# Patient Record
Sex: Female | Born: 1935 | Race: White | Hispanic: No | Marital: Married | Smoking: Never smoker
Health system: Southern US, Community
[De-identification: ages and names within clinical notes are randomized; demographics above are authoritative.]

## PROBLEM LIST (undated history)

## (undated) DIAGNOSIS — I6529 Occlusion and stenosis of unspecified carotid artery: Secondary | ICD-10-CM

## (undated) DIAGNOSIS — F3289 Other specified depressive episodes: Secondary | ICD-10-CM

## (undated) DIAGNOSIS — I10 Essential (primary) hypertension: Secondary | ICD-10-CM

## (undated) DIAGNOSIS — Z8679 Personal history of other diseases of the circulatory system: Secondary | ICD-10-CM

## (undated) DIAGNOSIS — M353 Polymyalgia rheumatica: Secondary | ICD-10-CM

## (undated) DIAGNOSIS — I2510 Atherosclerotic heart disease of native coronary artery without angina pectoris: Secondary | ICD-10-CM

## (undated) DIAGNOSIS — I6350 Cerebral infarction due to unspecified occlusion or stenosis of unspecified cerebral artery: Secondary | ICD-10-CM

## (undated) DIAGNOSIS — E669 Obesity, unspecified: Secondary | ICD-10-CM

## (undated) DIAGNOSIS — F329 Major depressive disorder, single episode, unspecified: Secondary | ICD-10-CM

## (undated) DIAGNOSIS — E1149 Type 2 diabetes mellitus with other diabetic neurological complication: Secondary | ICD-10-CM

## (undated) DIAGNOSIS — E785 Hyperlipidemia, unspecified: Secondary | ICD-10-CM

## (undated) DIAGNOSIS — D72829 Elevated white blood cell count, unspecified: Secondary | ICD-10-CM

## (undated) HISTORY — DX: Personal history of other diseases of the circulatory system: Z86.79

## (undated) HISTORY — DX: Obesity, unspecified: E66.9

## (undated) HISTORY — DX: Major depressive disorder, single episode, unspecified: F32.9

## (undated) HISTORY — DX: Polymyalgia rheumatica: M35.3

## (undated) HISTORY — PX: OTHER SURGICAL HISTORY: SHX169

## (undated) HISTORY — DX: Type 2 diabetes mellitus with other diabetic neurological complication: E11.49

## (undated) HISTORY — DX: Hyperlipidemia, unspecified: E78.5

## (undated) HISTORY — DX: Elevated white blood cell count, unspecified: D72.829

## (undated) HISTORY — PX: POSTERIOR LAMINECTOMY / DECOMPRESSION CERVICAL SPINE: SUR739

## (undated) HISTORY — PX: TOTAL ABDOMINAL HYSTERECTOMY: SHX209

## (undated) HISTORY — PX: REFRACTIVE SURGERY: SHX103

## (undated) HISTORY — PX: APPENDECTOMY: SHX54

## (undated) HISTORY — DX: Other specified depressive episodes: F32.89

## (undated) HISTORY — DX: Essential (primary) hypertension: I10

## (undated) HISTORY — DX: Cerebral infarction due to unspecified occlusion or stenosis of unspecified cerebral artery: I63.50

## (undated) HISTORY — DX: Occlusion and stenosis of unspecified carotid artery: I65.29

## (undated) HISTORY — DX: Atherosclerotic heart disease of native coronary artery without angina pectoris: I25.10

---

## 1999-09-14 ENCOUNTER — Encounter (HOSPITAL_BASED_OUTPATIENT_CLINIC_OR_DEPARTMENT_OTHER): Payer: Self-pay | Admitting: Internal Medicine

## 1999-09-14 ENCOUNTER — Inpatient Hospital Stay (HOSPITAL_COMMUNITY): Admission: EM | Admit: 1999-09-14 | Discharge: 1999-09-17 | Payer: Self-pay | Admitting: *Deleted

## 1999-11-24 ENCOUNTER — Encounter: Payer: Self-pay | Admitting: Internal Medicine

## 1999-11-24 ENCOUNTER — Ambulatory Visit (HOSPITAL_COMMUNITY): Admission: RE | Admit: 1999-11-24 | Discharge: 1999-11-24 | Payer: Self-pay | Admitting: Internal Medicine

## 2000-02-06 ENCOUNTER — Emergency Department (HOSPITAL_COMMUNITY): Admission: EM | Admit: 2000-02-06 | Discharge: 2000-02-06 | Payer: Self-pay | Admitting: Emergency Medicine

## 2000-03-21 ENCOUNTER — Encounter: Admission: RE | Admit: 2000-03-21 | Discharge: 2000-03-29 | Payer: Self-pay | Admitting: Internal Medicine

## 2000-05-02 ENCOUNTER — Emergency Department (HOSPITAL_COMMUNITY): Admission: EM | Admit: 2000-05-02 | Discharge: 2000-05-02 | Payer: Self-pay | Admitting: *Deleted

## 2000-10-04 ENCOUNTER — Encounter (INDEPENDENT_AMBULATORY_CARE_PROVIDER_SITE_OTHER): Payer: Self-pay | Admitting: Specialist

## 2000-10-04 ENCOUNTER — Other Ambulatory Visit: Admission: RE | Admit: 2000-10-04 | Discharge: 2000-10-04 | Payer: Self-pay | Admitting: Internal Medicine

## 2002-01-03 ENCOUNTER — Ambulatory Visit (HOSPITAL_COMMUNITY): Admission: RE | Admit: 2002-01-03 | Discharge: 2002-01-03 | Payer: Self-pay | Admitting: Neurology

## 2002-01-03 ENCOUNTER — Encounter: Payer: Self-pay | Admitting: Neurology

## 2004-01-20 ENCOUNTER — Inpatient Hospital Stay (HOSPITAL_COMMUNITY): Admission: AD | Admit: 2004-01-20 | Discharge: 2004-01-22 | Payer: Self-pay | Admitting: Neurology

## 2004-01-20 ENCOUNTER — Emergency Department (HOSPITAL_COMMUNITY): Admission: EM | Admit: 2004-01-20 | Discharge: 2004-01-20 | Payer: Self-pay | Admitting: Emergency Medicine

## 2004-01-21 ENCOUNTER — Encounter (INDEPENDENT_AMBULATORY_CARE_PROVIDER_SITE_OTHER): Payer: Self-pay | Admitting: *Deleted

## 2005-08-04 ENCOUNTER — Encounter: Admission: RE | Admit: 2005-08-04 | Discharge: 2005-08-04 | Payer: Self-pay | Admitting: Neurology

## 2005-09-05 ENCOUNTER — Encounter (INDEPENDENT_AMBULATORY_CARE_PROVIDER_SITE_OTHER): Payer: Self-pay | Admitting: *Deleted

## 2005-09-05 ENCOUNTER — Inpatient Hospital Stay (HOSPITAL_COMMUNITY): Admission: EM | Admit: 2005-09-05 | Discharge: 2005-09-16 | Payer: Self-pay | Admitting: Emergency Medicine

## 2005-11-27 ENCOUNTER — Ambulatory Visit (HOSPITAL_COMMUNITY): Admission: RE | Admit: 2005-11-27 | Discharge: 2005-11-27 | Payer: Self-pay | Admitting: Neurosurgery

## 2005-12-05 ENCOUNTER — Ambulatory Visit: Payer: Self-pay | Admitting: Cardiology

## 2005-12-22 ENCOUNTER — Ambulatory Visit: Payer: Self-pay

## 2005-12-26 ENCOUNTER — Ambulatory Visit: Payer: Self-pay | Admitting: Cardiology

## 2006-01-11 ENCOUNTER — Inpatient Hospital Stay (HOSPITAL_COMMUNITY): Admission: RE | Admit: 2006-01-11 | Discharge: 2006-01-15 | Payer: Self-pay | Admitting: Neurosurgery

## 2006-01-23 ENCOUNTER — Emergency Department (HOSPITAL_COMMUNITY): Admission: EM | Admit: 2006-01-23 | Discharge: 2006-01-23 | Payer: Self-pay | Admitting: *Deleted

## 2006-12-02 ENCOUNTER — Inpatient Hospital Stay (HOSPITAL_COMMUNITY): Admission: EM | Admit: 2006-12-02 | Discharge: 2006-12-05 | Payer: Self-pay | Admitting: Emergency Medicine

## 2007-07-10 ENCOUNTER — Ambulatory Visit: Payer: Self-pay | Admitting: Cardiology

## 2007-07-10 ENCOUNTER — Inpatient Hospital Stay (HOSPITAL_COMMUNITY): Admission: AD | Admit: 2007-07-10 | Discharge: 2007-07-16 | Payer: Self-pay | Admitting: Cardiology

## 2007-07-12 ENCOUNTER — Ambulatory Visit: Payer: Self-pay | Admitting: Surgery

## 2007-07-12 ENCOUNTER — Encounter: Payer: Self-pay | Admitting: Cardiology

## 2007-08-02 ENCOUNTER — Ambulatory Visit: Payer: Self-pay | Admitting: Cardiology

## 2007-08-02 LAB — CONVERTED CEMR LAB
Basophils Absolute: 0 10*3/uL (ref 0.0–0.1)
Basophils Relative: 0.4 % (ref 0.0–1.0)
Eosinophils Absolute: 0.2 10*3/uL (ref 0.0–0.7)
HCT: 38.8 % (ref 36.0–46.0)
MCHC: 32.4 g/dL (ref 30.0–36.0)
MCV: 88.6 fL (ref 78.0–100.0)
Monocytes Absolute: 1.2 10*3/uL — ABNORMAL HIGH (ref 0.1–1.0)
Neutro Abs: 7.4 10*3/uL (ref 1.4–7.7)
Neutrophils Relative %: 64.1 % (ref 43.0–77.0)
RBC: 4.37 M/uL (ref 3.87–5.11)

## 2007-08-08 ENCOUNTER — Inpatient Hospital Stay (HOSPITAL_COMMUNITY): Admission: EM | Admit: 2007-08-08 | Discharge: 2007-08-15 | Payer: Self-pay | Admitting: Emergency Medicine

## 2007-08-11 ENCOUNTER — Ambulatory Visit: Payer: Self-pay | Admitting: Infectious Diseases

## 2008-01-15 ENCOUNTER — Ambulatory Visit: Payer: Self-pay | Admitting: Cardiology

## 2008-01-15 LAB — CONVERTED CEMR LAB
Basophils Absolute: 0.1 10*3/uL (ref 0.0–0.1)
CO2: 28 meq/L (ref 19–32)
Glucose, Bld: 233 mg/dL — ABNORMAL HIGH (ref 70–99)
Hemoglobin: 13.4 g/dL (ref 12.0–15.0)
Lymphocytes Relative: 26.3 % (ref 12.0–46.0)
MCHC: 33.7 g/dL (ref 30.0–36.0)
Monocytes Relative: 8.1 % (ref 3.0–12.0)
Neutrophils Relative %: 64.1 % (ref 43.0–77.0)
Platelets: 220 10*3/uL (ref 150–400)
Potassium: 5 meq/L (ref 3.5–5.1)
RDW: 15.9 % — ABNORMAL HIGH (ref 11.5–14.6)
Sodium: 140 meq/L (ref 135–145)

## 2008-01-16 ENCOUNTER — Ambulatory Visit: Payer: Self-pay

## 2008-07-11 ENCOUNTER — Inpatient Hospital Stay (HOSPITAL_COMMUNITY): Admission: EM | Admit: 2008-07-11 | Discharge: 2008-07-17 | Payer: Self-pay | Admitting: Emergency Medicine

## 2008-07-11 ENCOUNTER — Ambulatory Visit: Payer: Self-pay | Admitting: Internal Medicine

## 2008-07-31 DIAGNOSIS — I2510 Atherosclerotic heart disease of native coronary artery without angina pectoris: Secondary | ICD-10-CM

## 2008-07-31 DIAGNOSIS — E785 Hyperlipidemia, unspecified: Secondary | ICD-10-CM | POA: Insufficient documentation

## 2008-07-31 DIAGNOSIS — I110 Hypertensive heart disease with heart failure: Secondary | ICD-10-CM

## 2008-07-31 DIAGNOSIS — I6529 Occlusion and stenosis of unspecified carotid artery: Secondary | ICD-10-CM

## 2008-07-31 DIAGNOSIS — Z8679 Personal history of other diseases of the circulatory system: Secondary | ICD-10-CM

## 2008-07-31 DIAGNOSIS — D72829 Elevated white blood cell count, unspecified: Secondary | ICD-10-CM | POA: Insufficient documentation

## 2008-07-31 DIAGNOSIS — E669 Obesity, unspecified: Secondary | ICD-10-CM

## 2008-07-31 DIAGNOSIS — I6350 Cerebral infarction due to unspecified occlusion or stenosis of unspecified cerebral artery: Secondary | ICD-10-CM | POA: Insufficient documentation

## 2008-07-31 DIAGNOSIS — F329 Major depressive disorder, single episode, unspecified: Secondary | ICD-10-CM

## 2008-07-31 DIAGNOSIS — E1143 Type 2 diabetes mellitus with diabetic autonomic (poly)neuropathy: Secondary | ICD-10-CM

## 2008-07-31 DIAGNOSIS — Z862 Personal history of diseases of the blood and blood-forming organs and certain disorders involving the immune mechanism: Secondary | ICD-10-CM

## 2008-07-31 DIAGNOSIS — M353 Polymyalgia rheumatica: Secondary | ICD-10-CM

## 2008-07-31 DIAGNOSIS — Z8639 Personal history of other endocrine, nutritional and metabolic disease: Secondary | ICD-10-CM

## 2008-08-05 ENCOUNTER — Encounter: Payer: Self-pay | Admitting: Cardiology

## 2008-08-05 ENCOUNTER — Ambulatory Visit: Payer: Self-pay | Admitting: Cardiology

## 2008-09-22 ENCOUNTER — Emergency Department (HOSPITAL_COMMUNITY): Admission: EM | Admit: 2008-09-22 | Discharge: 2008-09-23 | Payer: Self-pay | Admitting: Emergency Medicine

## 2008-11-16 ENCOUNTER — Inpatient Hospital Stay (HOSPITAL_COMMUNITY): Admission: EM | Admit: 2008-11-16 | Discharge: 2008-11-19 | Payer: Self-pay | Admitting: Emergency Medicine

## 2008-11-18 ENCOUNTER — Encounter (INDEPENDENT_AMBULATORY_CARE_PROVIDER_SITE_OTHER): Payer: Self-pay | Admitting: Gastroenterology

## 2009-02-25 ENCOUNTER — Emergency Department (HOSPITAL_COMMUNITY): Admission: EM | Admit: 2009-02-25 | Discharge: 2009-02-26 | Payer: Self-pay | Admitting: Emergency Medicine

## 2009-03-08 ENCOUNTER — Ambulatory Visit (HOSPITAL_COMMUNITY): Admission: RE | Admit: 2009-03-08 | Discharge: 2009-03-08 | Payer: Self-pay | Admitting: Internal Medicine

## 2009-10-05 ENCOUNTER — Telehealth: Payer: Self-pay | Admitting: Internal Medicine

## 2010-05-31 NOTE — Progress Notes (Signed)
Summary: Appt sooner than next available   Phone Note From Other Clinic   Caller: Lowella Bandy [phone] at Nursing Home Call For: Dr Juanda Chance Reason for Call: Schedule Patient Appt Summary of Call: Her Hemocults are geting lower & lower doesnt feel that patient can wait until next available on 12-13-09. Initial call taken by: Leanor Kail [facility],  October 05, 2009 12:41 PM  Follow-up for Phone Call        Pt. will see Dr.Lemuel Boodram on 10-11-09 at 2:15pm. .  Follow-up by: Laureen Ochs LPN,  October 05, 4780 1:34 PM     Appended Document: Appt sooner than next available Per Dr.Herndon Grill, pt. is actually a pt. of Dr.Ganim at [facility]. I let Lowella Bandy know that pt's appt. here is cancelled and she will need to call Dr.Ganims office for GI care.

## 2010-08-04 LAB — CBC
HCT: 37.1 % (ref 36.0–46.0)
Hemoglobin: 12.1 g/dL (ref 12.0–15.0)
MCHC: 32.8 g/dL (ref 30.0–36.0)
RBC: 4.46 MIL/uL (ref 3.87–5.11)
RDW: 16.9 % — ABNORMAL HIGH (ref 11.5–15.5)

## 2010-08-04 LAB — DIFFERENTIAL
Basophils Absolute: 0 10*3/uL (ref 0.0–0.1)
Eosinophils Relative: 1 % (ref 0–5)
Lymphocytes Relative: 20 % (ref 12–46)
Monocytes Absolute: 1.2 10*3/uL — ABNORMAL HIGH (ref 0.1–1.0)
Monocytes Relative: 10 % (ref 3–12)
Neutro Abs: 8.8 10*3/uL — ABNORMAL HIGH (ref 1.7–7.7)

## 2010-08-04 LAB — URINALYSIS, ROUTINE W REFLEX MICROSCOPIC
Glucose, UA: NEGATIVE mg/dL
Hgb urine dipstick: NEGATIVE
pH: 5.5 (ref 5.0–8.0)

## 2010-08-04 LAB — BASIC METABOLIC PANEL
CO2: 27 mEq/L (ref 19–32)
Calcium: 9.6 mg/dL (ref 8.4–10.5)
GFR calc Af Amer: >60 mL/min (ref >60)
GFR calc non Af Amer: >60 mL/min (ref >60)
Glucose, Bld: 138 mg/dL — ABNORMAL HIGH (ref 70–99)
Potassium: 3.8 mEq/L (ref 3.5–5.1)
Sodium: 136 mEq/L (ref 135–145)

## 2010-08-04 LAB — URINE MICROSCOPIC-ADD ON

## 2010-08-07 LAB — CBC
HCT: 21.3 % — ABNORMAL LOW (ref 36.0–46.0)
Hemoglobin: 11 g/dL — ABNORMAL LOW (ref 12.0–15.0)
Hemoglobin: 6.7 g/dL — CL (ref 12.0–15.0)
MCHC: 31.3 g/dL (ref 30.0–36.0)
MCHC: 31.5 g/dL (ref 30.0–36.0)
MCV: 67 fL — ABNORMAL LOW (ref 78.0–100.0)
MCV: 72.9 fL — ABNORMAL LOW (ref 78.0–100.0)
MCV: 73.5 fL — ABNORMAL LOW (ref 78.0–100.0)
MCV: 73.6 fL — ABNORMAL LOW (ref 78.0–100.0)
Platelets: 173 10*3/uL (ref 150–400)
Platelets: 173 K/uL (ref 150–400)
Platelets: 201 10*3/uL (ref 150–400)
Platelets: 219 10*3/uL (ref 150–400)
RBC: 3.17 MIL/uL — ABNORMAL LOW (ref 3.87–5.11)
RBC: 3.97 MIL/uL (ref 3.87–5.11)
RBC: 4.32 MIL/uL (ref 3.87–5.11)
RBC: 4.92 MIL/uL (ref 3.87–5.11)
RDW: 20.3 % — ABNORMAL HIGH (ref 11.5–15.5)
RDW: 22.5 % — ABNORMAL HIGH (ref 11.5–15.5)
RDW: 22.9 % — ABNORMAL HIGH (ref 11.5–15.5)
RDW: 23 % — ABNORMAL HIGH (ref 11.5–15.5)
WBC: 10.3 10*3/uL (ref 4.0–10.5)
WBC: 11.7 10*3/uL — ABNORMAL HIGH (ref 4.0–10.5)
WBC: 12.6 10*3/uL — ABNORMAL HIGH (ref 4.0–10.5)
WBC: 13.8 10*3/uL — ABNORMAL HIGH (ref 4.0–10.5)
WBC: 13.9 10*3/uL — ABNORMAL HIGH (ref 4.0–10.5)
WBC: 14.1 K/uL — ABNORMAL HIGH (ref 4.0–10.5)

## 2010-08-07 LAB — HEMOCCULT GUIAC POC 1CARD (OFFICE): Fecal Occult Bld: POSITIVE

## 2010-08-07 LAB — GLUCOSE, CAPILLARY
Glucose-Capillary: 121 mg/dL — ABNORMAL HIGH (ref 70–99)
Glucose-Capillary: 131 mg/dL — ABNORMAL HIGH (ref 70–99)
Glucose-Capillary: 136 mg/dL — ABNORMAL HIGH (ref 70–99)
Glucose-Capillary: 144 mg/dL — ABNORMAL HIGH (ref 70–99)
Glucose-Capillary: 146 mg/dL — ABNORMAL HIGH (ref 70–99)
Glucose-Capillary: 146 mg/dL — ABNORMAL HIGH (ref 70–99)
Glucose-Capillary: 160 mg/dL — ABNORMAL HIGH (ref 70–99)
Glucose-Capillary: 162 mg/dL — ABNORMAL HIGH (ref 70–99)
Glucose-Capillary: 164 mg/dL — ABNORMAL HIGH (ref 70–99)
Glucose-Capillary: 171 mg/dL — ABNORMAL HIGH (ref 70–99)
Glucose-Capillary: 182 mg/dL — ABNORMAL HIGH (ref 70–99)
Glucose-Capillary: 196 mg/dL — ABNORMAL HIGH (ref 70–99)
Glucose-Capillary: 196 mg/dL — ABNORMAL HIGH (ref 70–99)
Glucose-Capillary: 205 mg/dL — ABNORMAL HIGH (ref 70–99)
Glucose-Capillary: 209 mg/dL — ABNORMAL HIGH (ref 70–99)
Glucose-Capillary: 209 mg/dL — ABNORMAL HIGH (ref 70–99)
Glucose-Capillary: 216 mg/dL — ABNORMAL HIGH (ref 70–99)
Glucose-Capillary: 223 mg/dL — ABNORMAL HIGH (ref 70–99)
Glucose-Capillary: 246 mg/dL — ABNORMAL HIGH (ref 70–99)
Glucose-Capillary: 254 mg/dL — ABNORMAL HIGH (ref 70–99)
Glucose-Capillary: 298 mg/dL — ABNORMAL HIGH (ref 70–99)
Glucose-Capillary: 298 mg/dL — ABNORMAL HIGH (ref 70–99)
Glucose-Capillary: 99 mg/dL (ref 70–99)

## 2010-08-07 LAB — URINALYSIS, ROUTINE W REFLEX MICROSCOPIC
Bilirubin Urine: NEGATIVE
Glucose, UA: NEGATIVE mg/dL
Hgb urine dipstick: NEGATIVE
Ketones, ur: NEGATIVE mg/dL
Nitrite: NEGATIVE
Protein, ur: NEGATIVE mg/dL
Specific Gravity, Urine: 1.006 (ref 1.005–1.030)
Urobilinogen, UA: 0.2 mg/dL (ref 0.0–1.0)
pH: 6 (ref 5.0–8.0)

## 2010-08-07 LAB — TYPE AND SCREEN
ABO/RH(D): O NEG
Antibody Screen: NEGATIVE

## 2010-08-07 LAB — URINE MICROSCOPIC-ADD ON

## 2010-08-07 LAB — COMPREHENSIVE METABOLIC PANEL
ALT: 45 U/L — ABNORMAL HIGH (ref 0–35)
ALT: 47 U/L — ABNORMAL HIGH (ref 0–35)
ALT: 52 U/L — ABNORMAL HIGH (ref 0–35)
AST: 63 U/L — ABNORMAL HIGH (ref 0–37)
AST: 73 U/L — ABNORMAL HIGH (ref 0–37)
AST: 84 U/L — ABNORMAL HIGH (ref 0–37)
Albumin: 3.5 g/dL (ref 3.5–5.2)
Albumin: 3.7 g/dL (ref 3.5–5.2)
Alkaline Phosphatase: 84 U/L (ref 39–117)
CO2: 26 mEq/L (ref 19–32)
Calcium: 9.2 mg/dL (ref 8.4–10.5)
Chloride: 105 mEq/L (ref 96–112)
Chloride: 107 mEq/L (ref 96–112)
Chloride: 109 mEq/L (ref 96–112)
Creatinine, Ser: 0.73 mg/dL (ref 0.4–1.2)
Creatinine, Ser: 0.73 mg/dL (ref 0.4–1.2)
Creatinine, Ser: 0.77 mg/dL (ref 0.4–1.2)
GFR calc Af Amer: >60 mL/min (ref >60)
GFR calc Af Amer: >60 mL/min (ref >60)
GFR calc Af Amer: >60 mL/min (ref >60)
GFR calc non Af Amer: >60 mL/min (ref >60)
Potassium: 4.5 mEq/L (ref 3.5–5.1)
Sodium: 139 mEq/L (ref 135–145)
Sodium: 139 mEq/L (ref 135–145)
Sodium: 142 mEq/L (ref 135–145)
Total Bilirubin: 0.3 mg/dL (ref 0.3–1.2)
Total Bilirubin: 0.3 mg/dL (ref 0.3–1.2)
Total Bilirubin: 0.6 mg/dL (ref 0.3–1.2)

## 2010-08-07 LAB — BASIC METABOLIC PANEL WITH GFR
BUN: 17 mg/dL (ref 6–23)
Calcium: 8.9 mg/dL (ref 8.4–10.5)
GFR calc Af Amer: >60 mL/min (ref >60)
GFR calc non Af Amer: >60 mL/min (ref >60)
Glucose, Bld: 128 mg/dL — ABNORMAL HIGH (ref 70–99)
Sodium: 137 meq/L (ref 135–145)

## 2010-08-07 LAB — LIPID PANEL
LDL Cholesterol: 53 mg/dL (ref 0–99)
Total CHOL/HDL Ratio: 3.8 RATIO
Triglycerides: 124 mg/dL (ref <150)
VLDL: 25 mg/dL (ref 0–40)

## 2010-08-07 LAB — DIFFERENTIAL
Basophils Absolute: 0.1 K/uL (ref 0.0–0.1)
Basophils Relative: 1 % (ref 0–1)
Eosinophils Absolute: 0.3 10*3/uL (ref 0.0–0.7)
Eosinophils Relative: 2 % (ref 0–5)
Lymphocytes Relative: 34 % (ref 12–46)
Lymphs Abs: 4.8 10*3/uL — ABNORMAL HIGH (ref 0.7–4.0)
Monocytes Absolute: 1.3 10*3/uL — ABNORMAL HIGH (ref 0.1–1.0)
Monocytes Relative: 9 % (ref 3–12)
Neutro Abs: 7.6 10*3/uL (ref 1.7–7.7)
Neutrophils Relative %: 54 % (ref 43–77)

## 2010-08-07 LAB — CK TOTAL AND CKMB (NOT AT ARMC)
CK, MB: 0.9 ng/mL (ref 0.3–4.0)
CK, MB: 1 ng/mL (ref 0.3–4.0)
CK, MB: 1 ng/mL (ref 0.3–4.0)
Relative Index: INVALID (ref 0.0–2.5)
Relative Index: INVALID (ref 0.0–2.5)
Total CK: 27 U/L (ref 7–177)
Total CK: 44 U/L (ref 7–177)

## 2010-08-07 LAB — TSH: TSH: 4.048 u[IU]/mL (ref 0.350–4.500)

## 2010-08-07 LAB — ABO/RH: ABO/RH(D): O NEG

## 2010-08-07 LAB — TROPONIN I
Troponin I: 0.01 ng/mL (ref 0.00–0.06)
Troponin I: 0.02 ng/mL (ref 0.00–0.06)

## 2010-08-07 LAB — BASIC METABOLIC PANEL
CO2: 26 mEq/L (ref 19–32)
Chloride: 104 mEq/L (ref 96–112)
Creatinine, Ser: 0.81 mg/dL (ref 0.4–1.2)
Potassium: 4.1 mEq/L (ref 3.5–5.1)

## 2010-08-09 LAB — GLUCOSE, CAPILLARY
Glucose-Capillary: 182 mg/dL — ABNORMAL HIGH (ref 70–99)
Glucose-Capillary: 193 mg/dL — ABNORMAL HIGH (ref 70–99)
Glucose-Capillary: 211 mg/dL — ABNORMAL HIGH (ref 70–99)

## 2010-08-11 LAB — COMPREHENSIVE METABOLIC PANEL
ALT: 30 U/L (ref 0–35)
ALT: 38 U/L — ABNORMAL HIGH (ref 0–35)
AST: 51 U/L — ABNORMAL HIGH (ref 0–37)
Albumin: 3.1 g/dL — ABNORMAL LOW (ref 3.5–5.2)
Alkaline Phosphatase: 62 U/L (ref 39–117)
Alkaline Phosphatase: 54 U/L (ref 39–117)
BUN: 15 mg/dL (ref 6–23)
CO2: 27 mEq/L (ref 19–32)
CO2: 30 mEq/L (ref 19–32)
Calcium: 9.6 mg/dL (ref 8.4–10.5)
Chloride: 105 mEq/L (ref 96–112)
Chloride: 108 mEq/L (ref 96–112)
Chloride: 99 mEq/L (ref 96–112)
Creatinine, Ser: 0.85 mg/dL (ref 0.4–1.2)
GFR calc Af Amer: >60 mL/min (ref >60)
GFR calc non Af Amer: >60 mL/min (ref >60)
GFR calc non Af Amer: >60 mL/min (ref >60)
Glucose, Bld: 159 mg/dL — ABNORMAL HIGH (ref 70–99)
Glucose, Bld: 125 mg/dL — ABNORMAL HIGH (ref 70–99)
Glucose, Bld: 168 mg/dL — ABNORMAL HIGH (ref 70–99)
Potassium: 4.3 mEq/L (ref 3.5–5.1)
Potassium: 4.6 mEq/L (ref 3.5–5.1)
Sodium: 141 mEq/L (ref 135–145)
Total Bilirubin: 0.5 mg/dL (ref 0.3–1.2)
Total Bilirubin: 0.5 mg/dL (ref 0.3–1.2)
Total Bilirubin: 0.9 mg/dL (ref 0.3–1.2)
Total Protein: 5.8 g/dL — ABNORMAL LOW (ref 6.0–8.3)
Total Protein: 5.5 g/dL — ABNORMAL LOW (ref 6.0–8.3)

## 2010-08-11 LAB — GLUCOSE, CAPILLARY
Glucose-Capillary: 135 mg/dL — ABNORMAL HIGH (ref 70–99)
Glucose-Capillary: 144 mg/dL — ABNORMAL HIGH (ref 70–99)
Glucose-Capillary: 144 mg/dL — ABNORMAL HIGH (ref 70–99)
Glucose-Capillary: 164 mg/dL — ABNORMAL HIGH (ref 70–99)
Glucose-Capillary: 206 mg/dL — ABNORMAL HIGH (ref 70–99)
Glucose-Capillary: 232 mg/dL — ABNORMAL HIGH (ref 70–99)
Glucose-Capillary: 37 mg/dL — CL (ref 70–99)
Glucose-Capillary: 391 mg/dL — ABNORMAL HIGH (ref 70–99)
Glucose-Capillary: 74 mg/dL (ref 70–99)
Glucose-Capillary: 192 mg/dL — ABNORMAL HIGH (ref 70–99)
Glucose-Capillary: 200 mg/dL — ABNORMAL HIGH (ref 70–99)
Glucose-Capillary: 296 mg/dL — ABNORMAL HIGH (ref 70–99)
Glucose-Capillary: 54 mg/dL — ABNORMAL LOW (ref 70–99)
Glucose-Capillary: 88 mg/dL (ref 70–99)

## 2010-08-11 LAB — BASIC METABOLIC PANEL
Chloride: 108 mEq/L (ref 96–112)
GFR calc non Af Amer: >60 mL/min (ref >60)
Potassium: 4.6 mEq/L (ref 3.5–5.1)
Sodium: 142 mEq/L (ref 135–145)

## 2010-08-11 LAB — DIFFERENTIAL
Basophils Absolute: 0 10*3/uL (ref 0.0–0.1)
Eosinophils Absolute: 0.1 10*3/uL (ref 0.0–0.7)
Eosinophils Relative: 1 % (ref 0–5)
Lymphocytes Relative: 31 % (ref 12–46)
Lymphs Abs: 4.8 10*3/uL — ABNORMAL HIGH (ref 0.7–4.0)
Neutrophils Relative %: 61 % (ref 43–77)

## 2010-08-11 LAB — CBC
HCT: 34.3 % — ABNORMAL LOW (ref 36.0–46.0)
HCT: 39.2 % (ref 36.0–46.0)
Hemoglobin: 11.6 g/dL — ABNORMAL LOW (ref 12.0–15.0)
Hemoglobin: 12.6 g/dL (ref 12.0–15.0)
Hemoglobin: 13 g/dL (ref 12.0–15.0)
Hemoglobin: 13.6 g/dL (ref 12.0–15.0)
MCHC: 33.4 g/dL (ref 30.0–36.0)
MCV: 96.1 fL (ref 78.0–100.0)
MCV: 97.3 fL (ref 78.0–100.0)
RBC: 3.6 MIL/uL — ABNORMAL LOW (ref 3.87–5.11)
RBC: 3.81 MIL/uL — ABNORMAL LOW (ref 3.87–5.11)
RBC: 4.03 MIL/uL (ref 3.87–5.11)
RBC: 4.23 MIL/uL (ref 3.87–5.11)
RDW: 14.4 % (ref 11.5–15.5)
RDW: 13.5 % (ref 11.5–15.5)
WBC: 10.3 10*3/uL (ref 4.0–10.5)
WBC: 11.7 10*3/uL — ABNORMAL HIGH (ref 4.0–10.5)
WBC: 15.7 10*3/uL — ABNORMAL HIGH (ref 4.0–10.5)

## 2010-08-11 LAB — URINALYSIS, ROUTINE W REFLEX MICROSCOPIC
Ketones, ur: NEGATIVE mg/dL
Nitrite: NEGATIVE
Specific Gravity, Urine: 1.01 (ref 1.005–1.030)
Urobilinogen, UA: 0.2 mg/dL (ref 0.0–1.0)
pH: 8 (ref 5.0–8.0)

## 2010-08-11 LAB — CARDIAC PANEL(CRET KIN+CKTOT+MB+TROPI)
CK, MB: 1.9 ng/mL (ref 0.3–4.0)
CK, MB: 2 ng/mL (ref 0.3–4.0)
Relative Index: INVALID (ref 0.0–2.5)
Relative Index: INVALID (ref 0.0–2.5)
Total CK: 32 U/L (ref 7–177)
Total CK: 37 U/L (ref 7–177)
Troponin I: 0.03 ng/mL (ref 0.00–0.06)
Troponin I: 0.04 ng/mL (ref 0.00–0.06)

## 2010-08-11 LAB — PROTIME-INR
INR: 1 (ref 0.00–1.49)
Prothrombin Time: 13.9 seconds (ref 11.6–15.2)

## 2010-08-11 LAB — TSH: TSH: 1.824 u[IU]/mL (ref 0.350–4.500)

## 2010-08-11 LAB — URINE MICROSCOPIC-ADD ON

## 2010-08-11 LAB — GLUCOSE, RANDOM: Glucose, Bld: 143 mg/dL — ABNORMAL HIGH (ref 70–99)

## 2010-09-13 NOTE — Discharge Summary (Signed)
[name] [name], [name] [name] NO.:  [id number]   MEDICAL RECORD NO.:  [id number]          PATIENT TYPE:  INP   LOCATION:  5524                         FACILITY:  [facility]   PHYSICIAN:  Theodosia Paling, MD    DATE OF BIRTH:  Sep 27, 1926   DATE OF ADMISSION:  11/16/2008  DATE OF DISCHARGE:  11/19/2008                               DISCHARGE SUMMARY   PRIMARY CARE PHYSICIAN:  [facility], Dr. Murray Hodgkins.   ADMITTING HISTORY:  Please refer to the excellent admission note  dictated by Dr. Della Goo under history of present illness.   DISCHARGE DIAGNOSES:  1. Anemia.  2. Gastrointestinal bleed of unknown origin.  3. Transient ischemic attack.   SECONDARY DIAGNOSES:  1. History of type 2 diabetes mellitus.  2. History of hypertension.  3. History of coronary artery disease.  4. History of degenerative joint disease.   DISCHARGE MEDICATIONS:  1. Amlodipine 5 mg p.o. daily.  2. Atenolol 75 mg p.o. daily.  3. Lotensin 10 mg p.o. daily.  4. Valium 5 mg p.o. nightly.  5. Aranesp 45 mg p.o. q.12 hours.  6. Protonix 40 mg p.o. q.12 hours.  7. Senna 1 tab p.o. nightly p.r.n.  8. Zoloft 75 mg p.o. daily.  9. Effexor venlafaxine 25 mg p.o. daily.  10.Sliding scale insulin, sensitive.  11.Lantus 5 units subcutaneous nightly.   HOSPITAL COURSE:  1. The following issues were addressed during the hospitalization.      Transient ischemic attack.  The patient apparently had transient      facial drooping, which has resolved at the time of discharge.  She      underwent brain imaging, which was negative for any acute stroke.      At the time of discharge, the patient does not have any severe      motor deficits.  2. Gastrointestinal bleed.  The patient on arrival had a hemoglobin of      3.7 and fecal occult blood test was positive for blood.  The      patient underwent upper gastrointestinal endoscopy and colonoscopy,      which were essentially negative.  In  addition, CT angiography was      also performed and normal.  The patient is hemodynamically stable.      Her H and H were stable throughout the hospitalization.  She will      need follow up CBC in 1-week's time.  3. Microcytic anemia.  As mentioned under gastrointestinal bleed.      Iron will be continued.  CBC will be followed in 1-week's time.  4. Diabetes mellitus.  I have included 5 units of Lantus subcutaneous      nightly along with sliding scale insulin.  Further antidiabetic      agents can be adjusted in the skilled nursing facility as well.   CONSULTATIONS:  Dr. Evette Cristal on November 16, 2008.   PROCEDURES:  Endoscopy and colonoscopy November 18, 2008, which showed  diverticulosis, mild polyp in the descending colon.  Small rectal polyp.  Normal esophagogastroduodenoscopy.   IMAGING PERFORMED:  CT of the  head performed November 16, 2008 showing no  acute intracranial abnormality.  Cerebral atrophy and small vessel  ischemic damage.  CT angiography abdomen and pelvis showing no acute  findings or explanations for anemia with mildly prominent lymph node in  the upper abdomen.   DISPOSITION:  The patient is going back to skilled nursing facility for  further rehab.  She is to follow up with Dr. Murray Hodgkins in 1 week's  time with CBC and also for diabetes management.  Total time spent in  discharge of this patient 1 hour.      Theodosia Paling, MD  Electronically Signed     NP/MEDQ  D:  11/19/2008  T:  11/19/2008  Job:  [id number]   cc:   Lenon Curt. Chilton Si, M.D.

## 2010-09-13 NOTE — Consult Note (Signed)
[name] [name], [name] [name] NO.:  [id number]   MEDICAL RECORD NO.:  [id number]          PATIENT TYPE:  INP   LOCATION:  6522                         FACILITY:  [facility]   PHYSICIAN:  Graylin Shiver, M.D.   DATE OF BIRTH:  20-Oct-1926   DATE OF CONSULTATION:  11/16/2008  DATE OF DISCHARGE:                                 CONSULTATION   HISTORY OF PRESENT ILLNESS:  The patient is an 77 year old female  admitted to the hospital with complaints of headache, facial drooping,  dyspnea and fatigue.  She was found to have heme-positive stools and  have a hemoglobin of 6.7.  She denies any specific signs of GI bleeding.  She denies hematemesis, melena or hematochezia.  She recalls having a  colonoscopy many years ago and thinks it was normal.  She denies peptic  ulcer disease.  There is a family history of colon polyps in her  brother.   PAST HISTORY:  1. Hypertension.  2. Coronary artery disease.  She has a stent and is on Plavix.  3. History of type 2 diabetes.  4. Diabetic neuropathy and retinopathy.  5. Prior CVAs with right hemiparesis in the past.  6. Degenerative joint disease.  7. Cervical disk disease.  8. Peripheral vascular disease.   SURGERIES:  Abdominal hysterectomy, appendectomy, cervical disk  decompression, cataracts laser surgery to both eyes.   ALLERGIES:  PENICILLIN, CODEINE, and HYDROCODONE.   REVIEW OF SYSTEMS:  Not complaining of chest pain or shortness of breath  at this time.   PHYSICAL EXAMINATION:  GENERAL:  She is alert, oriented and does not  appear in any acute distress at this time.  VITAL SIGNS:  Stable.  HEART:  Regular rhythm.  No murmurs.  LUNGS:  Clear.  ABDOMEN:  Soft, nontender, no hepatosplenomegaly.   IMPRESSION:  1. Anemia with hemoglobin on admission of 6.7.  2. Heme-positive stool.  3. Multiple other medical problems as stated above.   PLAN:  Transfuse blood to get the hemoglobin and hematocrit up to an  adequate safe  level, then we will plan to do EGD and colonoscopy after  an adequate prep after she is a little stronger and can tolerate that.           ______________________________  Graylin Shiver, M.D.     SFG/MEDQ  D:  11/16/2008  T:  11/17/2008  Job:  [id number]   cc:   [facility]  Lenon Curt. Chilton Si, M.D.

## 2010-09-13 NOTE — Discharge Summary (Signed)
NAMEAVONDA, [name]             ACCOUNT NO.:  [id number]   MEDICAL RECORD NO.:  [id number]          PATIENT TYPE:  INP   LOCATION:  6735                         FACILITY:  [facility]   PHYSICIAN:  Mobolaji B. Bakare, M.D.DATE OF BIRTH:  June 15, 1926   DATE OF ADMISSION:  12/01/2006  DATE OF DISCHARGE:                               DISCHARGE SUMMARY   PRIMARY CARE PHYSICIAN:  Dr. Murray Hodgkins   FINAL DIAGNOSES:  1. Gastroenteritis, Clostridium difficile negative.  2. Diabetes mellitus with hypoglycemia.  3. Hypertension.  4. Dehydration.  5. Hyperkalemia, resolved.   SECONDARY DIAGNOSES:  1. History of cerebrovascular accident with right-sided hemiparesis.  2. Diabetic neuropathy.  3. Coronary artery disease.  4. History of polymyalgia rheumatica.  5. Cervical disk disease.   PROCEDURES:  CT scan of abdomen and pelvis was unremarkable for acute  intraabdominal or pelvic process.  Abdominal x-ray showed no acute  cardiopulmonary and abdominal process.   BRIEF HISTORY:  [name] [name] is a pleasant 77 year old Caucasian female  who resides at home with her husband; actually, her husband just  returned from a skilled nursing facility.  She presents with abdominal  cramping, nausea, vomiting, diarrhea for about 5 days and she has had  decreased p.o. intake, has been feeling progressively weaker.  She  presented to the emergency room.  Vital signs were blood pressure  157/32, temperature of 98.4.  Initial laboratory data revealed white  cell of 18,000 with hemoglobin of 12.7 and platelets of 256.  Her BUN  and creatinine were within normal limits.   She was admitted for dehydration, gastroenteritis.  Stools were sent for  further evaluation.   HOSPITAL COURSE:  #1 - GASTROENTERITIS.  The patient was started on IV  fluids half normal saline with potassium supplement.  She was managed  symptomatically with Phenergan and Dilaudid as needed, and also Lomotil  p.r.n.  Stool was sent for  culture.  Culture is pending.  She had two  negative C. difficile toxins.  She was, however, started on empiric  treatment for C. difficile given the fact that she had abdominal cramps  and diarrhea with leukocytosis of 24,000.  She would continue on Flagyl  to complete 10 days.  The patient is responding to Flagyl.  Currently,  white cell count has improved to 13,000 and I anticipate complete  resolution.  Diarrhea has resolved.  Stool consistency is normalizing.  Dehydration has improved.  BUN/creatinine have improved (7/0.84  respectively).  She is tolerating advancing her diet.   The patient developed hyperkalemia due to potassium supplement in the IV  fluid.  This has been changed and potassium has now normalized.   #2 - DIABETES MELLITUS WITH HYPOGLYCEMIA.  The patient's home dose of  insulin treatment was reduced on admission and it was noted that her  CBGs were trending downwards as she had an episode of hypoglycemia with  CBG of 25.  Insulin therapy was held and the patient's CBG is now  improving such that we can restart her medication at lower doses.  These  will be adjusted as necessary until discharge.   #3 -  HYPERTENSION.  The patient's blood pressure was normal on  admission.  Given the dehydration and possibility of renal  insufficiency, ACE inhibitor was held and atenolol was also held.  Her  blood pressure is now uncontrolled.  We are resuming atenolol and  benazepril.   DISPOSITION:  The patient plans to return home.  She may require home  health PT and OT.  She will be evaluated by therapist during this  hospitalization.   LABORATORY DATA AT THE TIME OF DICTATION:  Sodium 142, potassium 4.2,  chloride 110, bicarb 24, BUN 7, creatinine 0.84, and glucose 140.  White  cell 13,000; hemoglobin 11.6; platelets 197.   Addendum will be added to this dictation at the time of discharge.      Mobolaji B. Corky Downs, M.D.  Electronically Signed     MBB/MEDQ  D:   12/04/2006  T:  12/04/2006  Job:  [id number]   cc:   Lenon Curt. Chilton Si, M.D.

## 2010-09-13 NOTE — Discharge Summary (Signed)
[name] [name], [name] [name]             ACCOUNT NO.:  [id number]   MEDICAL RECORD NO.:  [id number]          PATIENT TYPE:  INP   LOCATION:  3001                         FACILITY:  [facility]   PHYSICIAN:  Hind I Elsaid, MD      DATE OF BIRTH:  02-05-1927   DATE OF ADMISSION:  08/08/2007  DATE OF DISCHARGE:  08/15/2007                               DISCHARGE SUMMARY   PRIMARY CARE PHYSICIAN:  Bertram Millard. Hyacinth Meeker, M.D. [employer] Event organiser G. Chilton Si, M.D.   PRIMARY CARDIOLOGIST:  Arturo Morton. Riley Kill, M.D., [facility].   NEUROLOGIST:  Pramod P. Pearlean Brownie, M.D.   DISCHARGE DIAGNOSES:  1. Bilateral shoulder pain, felt to be secondary to relapse of      polymyalgia rheumatica.  2. Insulin-dependent diabetes mellitus.  3. History of coronary artery disease, status post percutaneous      intervention with drug-eluting stent in the right coronary artery      in March 2009.  4. Persistent leucocytosis of unclear etiology.  5. Subclavian stenosis.  6. History of intracranial aneurysm.  7. History of transient ischemic attack.  8. Hypertension.  9. Hyperlipidemia.  10.Diabetic neuropathy.  11.History of polymyalgia rheumatica.  12.Cervical disc disease.   MEDICATIONS:  1. Aspirin 325 mg 1 daily.  2. Plavix 75 mg daily.  3. Amlodipine/benazepril 5/20 1 daily.  4. Atenolol 50 mg p.o. daily.  5. Lasix 40 mg daily.  6. Tramadol 50 mg p.o. q.i.d. p.r.n.  7. Isosorbide mononitrate 60 mg daily.  8. Diazepam 5 mg p.o. nightly. p.r.n.  9. Insulin Lantus 22 units subcu nightly.  10.Insulin NovoLog 40 units subcu with the meal.  11- prednisone 20 mg po to be tappered to 10 mg anD followup with MD   CONSULTATION:  Orthopedics consulted.  Infectious disease was consulted.   HISTORY OF PRESENT ILLNESS:  This is a 77 year old female with history  of coronary artery disease, insulin-dependent diabetes mellitus  presented with the history of increasing bilateral shoulder pain.  The  patient has a history of  previous cervical discectomy and fusion at C5  and C6 and C6-C7 with plates and screw in 2007 done by Dr. Sylvie Farrier.  She  has been seen by Dr. Sylvie Farrier and evaluated herself to be asymptomatic  related to her cervical spine.  She denies any numbness or paresthesias  in her upper extremity.  She mainly complains of left shoulder pain and  a pain involving the entire left arm, also she had pain into her right  arm.  She has a history of diabetic peripheral neuropathy.  She also has  a history of polymyalgia rheumatica.  The patient was admitted to the  hospital for this evaluation.   PROCEDURES:  1. X-ray:  Cervical x-ray negative for fracture or other acute      abnormality.  Chest x-ray has no active lung issue.  2. MRI of cervical spine, status post C5 to C7 fusion without      complicating feature and good decompression of the thecal sac and      foramina.  Mild spondylosis of C3-C4 without notable central canal  or foraminal stenosis.  No interval changes.  Small broad-based      disc bulge, particularly seen at C4, T3, and T4.  3. CT head without, contrast negative for acute intracranial      hemorrhage or edema.  Atrophy was small with an ischemic change.  4. X-ray of the left shoulder, no acute abnormality.  5. MRI of the upper extremity, glenohumeral effusion, subcortical and      subacromial and subdeltoid bursitis.  Differentiation include      inflammatory arthritis, infection, or posttraumatic reactive      change.  If infection is considered, consider aspiration, moderate      joint osteoarthritis.   HOSPITAL COURSE:  1. The patient was admitted for evaluation of left more than right      shoulder pain and tenderness to palpation with limitation of      shoulder movement.  The patient had a blood drawn mainly for ESR      which found to be 75 and C-reactive protein of 19, also rheumatoid      factor was less than 20.  Antinuclear factor was negative. The      patient had  aspiration of the left subacromial joint and the sample      was sent for culture and sensitivity which was negative.  The      patient also received intraarticular steroid, mainly Depo-Medrol 40      mg/1 mL.  The patient noticed improvement on the patient's      condition.  Also patient was continued on pain medications.      Infectious disease were consulted for evaluation of any underlying      infection.  Diagnosis was made of relapse of polymyalgia rheumatica      and the patient accordingly started on prednisone p.o.  No evidence      of infection or septic arthritis during hospitalization.  The      patient has significant improvement after started on prednisone      p.o. and diagnosis made with the relapse of polymyalgia rheumatica.      The patient need to followup with Dr. Murray Hodgkins for tapering      prednisone, so the patient reach to the appropriate small dose to      control her pain.  2. Diabetes mellitus remained labile with the previous reading.  We      understand that prednisone will increase her sugar.  The patient      was switched to insulin Lantus 22 units subcu with NovoLog 4 units      subcu to cover each meal.  The patient was advised to follow with      Murray Hodgkins, MD for further adjustment of her fingerstick.  3. Persistent leucocytosis.  There is no evidence of infection.  White      blood cells are coming down.  Her last white blood cell is 13.  The      patient need to follow up closely with her primary care physician.      During hospitalization, the patient also receive PT and OT and      their recommendation was no need for outpatient physical therapy.      The patient's both shoulders significantly improved.  We felt that      the bilateral shoulder pain was most probably secondary to flare of      polymyalgia rheumatica.  The patient need to be discharged to home  on prednisone and dose to be tapered by her primary care physician.      Hind  Bosie Helper, MD  Electronically Signed    HIE/MEDQ  D:  08/15/2007  T:  08/16/2007  Job:  [id number]   cc:   Lenon Curt. Chilton Si, M.D.  Frederica Kuster, MD

## 2010-09-13 NOTE — Discharge Summary (Signed)
[name] [name], [name] [name] NO.:  [id number]   MEDICAL RECORD NO.:  [id number]          PATIENT TYPE:  INP   LOCATION:  6529                         FACILITY:  [facility]   PHYSICIAN:  Arturo Morton. Riley Kill, MD, FACCDATE OF BIRTH:  [birth year]   DATE OF ADMISSION:  07/10/2007  DATE OF DISCHARGE:  07/16/2007                               DISCHARGE SUMMARY   PRIMARY CARDIOLOGIST:  Maisie Fus D. Riley Kill, MD, [facility]   PRIMARY CARE PHYSICIAN:  Lenon Curt. Chilton Si, M.D.   NEUROLOGIST:  Pramod P. Pearlean Brownie, M.D. and Clydene Fake, M.D.   DISCHARGE DIAGNOSES:  1. Unstable angina.  2. Coronary artery disease, status post percutaneous coronary      intervention and stenting of the mid right coronary artery with 3.0      x 15 mm Promus drug eluting stent this admission.  3. History of 3.5 cm intracranial aneurysm by MRA in 2005.      a.     Repeat MRA this admission shows no evidence of anterior       communicating aneurysm with no definite aneurysm seen on prior       study after further review.  4. Peripheral vascular disease with history of subclavian stenosis.  5. History of transient ischemic attacks.  6. 40-60% right internal carotid artery stenosis by ultrasound in      2007.  7. Status post neck surgery.  8. Hypertension.  9. Hyperlipidemia.  10.Insulin-dependent diabetes mellitus.  11.Diabetic peripheral neuropathy.  12.Obesity.  13.Depression.  14.Leukocytosis.  15.Polymyalgia rheumatica.   ALLERGIES:  CODEINE causes GI upset.  PENICILLIN causes rash.  IBUPROFEN, HYDROCODONE cause nightmares.   PROCEDURE:  1. Left heart catheterization with successful PCI and stenting of the      mid RCA with drug eluting stent.  2. Two-dimensional echocardiogram on July 12, 2007, showing an EF of      55-60% with prominent epicardial fat pad.   HISTORY OF PRESENT ILLNESS:  A 77 year old Caucasian female with a prior  history of CAD, who was evaluated in our clinic on July 10, 2007, for  progressive angina with a more significant episode occurring during the  prior weekend.  The decision was made to admit for further evaluation.   HOSPITAL COURSE:  We initially held off on anticoagulation secondary to  history of intracranial aneurysm based on MRA performed in September of  2005.  Neurology was consulted and they recommended a repeat MRA.  The  MRA showed no evidence of intracranial aneurysm.  The previous test was  also reviewed by radiology and no definite aneurysm was seen there  either.  The patient was placed on heparin therapy and underwent a  cardiac catheterization on March 12, revealing a 70-75% stenosis in the  proximal to mid right coronary artery.  Ejection fraction was normal.  She had no recurrent chest discomfort over the weekend and plans were  made for PCI of the RCA on Monday, March 16.  She was taken back to lab  on March 16, and underwent successful PCI and stenting of the proximal  RCA with placement of a 3.0  x 15 mm Promus drug eluting stent.  She  tolerated this procedure well and post procedure, she did have a brief  episode of chest discomfort relieved with intravenous Fentanyl.  She had  no EKG changes at that time and cardiac markers have since remained  negative.  She has been ambulating without recurrent discomfort and will  be discharged home today in good condition.   Notably, the patient has exhibited a leukocytosis throughout her  admission, but has remained afebrile.  Her urinalysis showed 36 WBCs,  although she had no complaints of dysuria, urgency, or frequency.  Urine  culture showed growth of multiple organisms and at this time, repeat  culture is pending.  Upon trending her leukocytes in the past, she has  chronic elevated WBCs.  We have no plans to initiate antibiotics at this  time unless urine culture reveals significant abnormality.   DISCHARGE LABORATORY DATA:  Hemoglobin 12.2, hematocrit 36.8, WBC 15.5,  platelets 204.   D-dimer 0.89.  INR 1.0. Sodium 137, potassium 4.3,  chloride 105, CO2 26, BUN 17, creatinine 0.79, glucose 164, calcium 9.2,  total bilirubin 0.5, alkaline phosphatase 85, AST 39, ALT 24, total  protein 6.8, albumin 3.8, CK 80, MB 1.8.  Total cholesterol 104,  triglycerides 280, HDL 22, LDL 26.  TSH 1.261.  Urinalysis showed small  amount of leukocytes with few epithelials and 3-6 WBC.  Urine culture is  pending.   DISPOSITION:  The patient is being discharged home today in good  condition.   FOLLOWUP:  We have arranged for follow-up with Dr. Riley Kill on April 3,  at 9 a.m.  She is to follow up with Dr. Chilton Si as previously scheduled.   DISCHARGE MEDICATIONS:  1. Aspirin 325 mg daily.  2. Plavix 75 mg daily.  3. Amlodipine benazepril 5/20 mg daily.  4. Atenolol 50 mg daily.  5. IMDUR 60 mg daily.  6. Multivitamin one daily.  7. Zoloft 100 mg daily.  8. Lasix 40 mg daily.  9. Humulin Insulin as previously prescribed.  10.Zolpidem 5 mg q.h.s.  11.Tramadol 50 mg q.i.d. p.r.n.  12.Diazepam 5 mg daily p.r.n.  13.Nitroglycerin 0.4 mg sublingual p.r.n. chest pain.   OUTSTANDING LAB STUDIES:  Urine culture is pending.   DURATION DISCHARGE ENCOUNTER:  40 minutes including physician time.      Nicolasa Ducking, ANP      Arturo Morton. Riley Kill, MD, [facility]  Electronically Signed    CB/MEDQ  D:  07/16/2007  T:  07/16/2007  Job:  [id number]   cc:   Lenon Curt. Chilton Si, M.D.

## 2010-09-13 NOTE — Op Note (Signed)
[name] [name], [name] [name] NO.:  [id number]   MEDICAL RECORD NO.:  [id number]           PATIENT TYPE:   LOCATION:                                 FACILITY:   PHYSICIAN:  Graylin Shiver, M.D.   DATE OF BIRTH:  [birth year]   DATE OF PROCEDURE:  11/18/2008  DATE OF DISCHARGE:                               OPERATIVE REPORT   INDICATIONS FOR PROCEDURE:  Anemia, heme-positive stool.   Informed consent was obtained after explanation of the risks of  bleeding, infection, and perforation.   PREMEDICATIONS:  1. Fentanyl 80 mcg IV.  2. Versed 7 mg IV.   PROCEDURE:  With the patient in the left lateral decubitus position, the  Pentax gastroscope was inserted into the oropharynx and passed into the  esophagus.  It was advanced down the esophagus, then into the stomach  and into the duodenum.  The second portion and bulb of the duodenum were  normal.  The stomach looked normal in its entirety.  The esophagus  looked normal in its entirety.  The patient tolerated the procedure well  without complications.   IMPRESSION:  Normal esophagogastroduodenoscopy.   There is nothing on this exam to explain anemia or a heme-positive  stool.           ______________________________  Graylin Shiver, M.D.     SFG/MEDQ  D:  11/18/2008  T:  11/18/2008  Job:  [id number]   cc:   Lenon Curt. Chilton Si, M.D.  [facility].

## 2010-09-13 NOTE — Consult Note (Signed)
[name] [name], [name] [name] NO.:  [id number]   MEDICAL RECORD NO.:  [id number]           PATIENT TYPE:   LOCATION:                                 FACILITY:   PHYSICIAN:  Kerrin Champagne, M.D.   DATE OF BIRTH:  [birth year]   DATE OF CONSULTATION:  08/09/2007  DATE OF DISCHARGE:                                 CONSULTATION   ORTHOPEDIC DIAGNOSES:  1. Left shoulder and arm pain, bursitis and tendinitis versus possible      shoulder pyarthrosis.  2. Rule out multiarticular acute arthrosis and gout versus      rheumatologic diagnosis.  3. Insulin-dependent diabetes mellitus.  4. Coronary artery disease, status post stent placement 2-3 weeks ago.  5. Hypertension.  6. Chronic leukocytosis.   HISTORY OF PRESENT ILLNESS:  This is a 77 year old female with a history  of coronary artery disease and insulin-dependent diabetes mellitus.  She  had a cardiac angiogram with stent placement nearly 2-3 weeks ago.  She  presents with a 3-day history of increasing left shoulder, elbow, and  hand pain, but also describes pain into the right arm and right hand.  She has had a history of previous anterior cervical discectomy and  fusion at C5-6 and C6-7 with plates and screws in 2007 by Dr. Phoebe Perch.  She has been seen by Dr. Phoebe Perch and evaluated and felt to be  asymptomatic relative to her cervical spine.  She has no complaints of  numbness or paresthesias in her upper extremities.  She explains that  she awoke Wednesday August 07, 2007, with left shoulder pain and pain  involving the entire left arm.  She had pain into her right hand as  well.  She has a history of diabetic peripheral neuropathy.  Previous  injection of the left shoulder was done by Dr. Trellis Paganini years ago.  She  has no history of gout, does have a history of arthrosis in the past in  multiple areas.   CLINICAL EXAM:  VITAL SIGNS:  Her temperature maximum is 99.4.  She has  had some elevation of sugars, and her hemoglobin  A1c is elevated.  Her  vital signs are stable.  She is on O2 per nasal cannula.  MUSCULOSKELETAL:  Attempts at even touching the left shoulder is  accompanied by guarding.  Palpation of the left AC joint is  uncomfortable and tender as is palpation of the medial collarbone and  clavicle.  She has pain immediately with attempts at abduction and  anterior flexion of the left shoulder, pain with external and internal  rotation of the left shoulder to any extent.  The shoulder seems to move  smoothly though without any grating or crepitus.  She complains of pain  with palpation of the left elbow.  The elbow itself is not warm or  swollen.  The left shoulder shows no significant warmth or swelling as  well.  She complains of pain with palpation of the left hand and wrist.  Supination and pronation of the left forearm is uncomfortable and  painful as well.  Pulses are normal.  Good capillary refill, however,  warmth to the entire left arm.  No significant swelling of the left  elbow, wrist, or hand noted.  She tends to avoid active movement of the  left arm from the shoulder downwards.   LABORATORY DATA:  Radiographs of the left shoulder, internal rotation,  anterior posterior view, and a scapulolateral Y view of the left  proximal humerus shows no fracture, dislocation, or subluxation present.  Subacromial space appears to be well maintained.  There is no  significant glenohumeral arthrosis changes.  Mild AC arthrosis involving  the acromioclavicular joint.   INITIAL IMPRESSION:  1. Left shoulder pain, bursitis, and tendinitis versus shoulder      pyarthrosis or a septic bursitis.  This patient has a history of      insulin-dependent diabetes, a chronic history of leukocytosis with      multiarticular pain involving the left shoulder, elbow, and hand      but also the right hand and arm as well.  With her history of      diabetes, she is at risk of infection without significant response       due to diabetes.  Therefore, evaluation of the shoulder should be      undertaken and to determine if there is any sign of abscess,      pyomyositis, or significant shoulder effusion.  2. Insulin-dependent diabetes mellitus.  3. Coronary artery disease, status post recent stent placement.  The      patient has had recent MRI scan of the cervical spine in spite of      recent stent placed without sequelae.  4. Hypertension.  5. Chronic leukocytosis.   PLAN:  We will evaluate laboratory tests for sedimentation rate, CBC, C-  reactive protein, uric acid, and an arthritis panel.  We will go ahead  with the MRI scan in spite of recent cardiac stent placement as she has  had previous MRI of her cervical spine on August 08, 2007.  This is to  evaluate for any signs of pyarthrosis or left shoulder effusion.  This  would help [place] determine further the anatomy of the left shoulder or any  signs of rotator cuff deficiency, tendinitis, bursitis, etc., at  examining the [facility] joint left side.  Based on the MRI scan, we will  determine if injection treatment with steroid and Marcaine is feasible.  Presently, as she is on Plavix and aspirin and had recent cardiac stent  placement, I believe that she should not be treated with nonsteroidal  antiinflammatory agents for fear of causing fluid retention or possible  congestive heart failure.  Additionally, the tendency for nonsteroidal  antiinflammatory agents to compete with aspirin for its beneficial  effect as known.  Consider physical therapy as well as injection  treatment with steroids pending the MRI study.      Kerrin Champagne, M.D.  Electronically Signed     JEN/MEDQ  D:  08/10/2007  T:  08/10/2007  Job:  [id number]

## 2010-09-13 NOTE — Cardiovascular Report (Signed)
[name] [name], GRANDMAISON NO.:  [id number]   MEDICAL RECORD NO.:  [id number]          PATIENT TYPE:  INP   LOCATION:  2036                         FACILITY:  [facility]   PHYSICIAN:  Arturo Morton. Riley Kill, MD, FACCDATE OF BIRTH:  07/26/1926   DATE OF PROCEDURE:  07/15/2007  DATE OF DISCHARGE:                            CARDIAC CATHETERIZATION   INDICATIONS:  Ms. Mirabella is a 77 year old who presents with progressive  exertional angina.  She has insulin dependent diabetes mellitus.  She  has had a recent history of exertional discomfort.  I had careful  discussions with her.  We evaluated her case in detail.  The history  suggests a class II to III angina pectoris.  Diagnostic catheterization  demonstrated a shelf-like lesion in the mid right coronary.  We  discussed the various options.  She has had an elevated white count.  Her daughter insists that many in the family have elevated white counts.  There has been no localized sign of infection.  She has not had fever.  The current study was done after a careful consideration.   PROCEDURE:  1. Percutaneous stenting of the right coronary artery with a drug-      eluting stent.  2. Intravascular ultrasound post stent implantation.   DESCRIPTION OF PROCEDURE:  The patient was brought to the cath lab and  prepped and draped in usual fashion after informed consent through an  anterior puncture the right femoral artery was easily entered.  A 6-  French sheath was then placed.  Following this, Bivalirudin was given  according to protocol, ACT checked and found to be appropriate.  We re-  flowed the patient's chest to ensure that there is no obvious evidence  of pneumonia.  A JR-4 guiding catheter was then utilized.  Side holes  were used.  We cannulated the right fairly easily with an appropriate  ACT, the lesion was then crossed with a Prowater wire.  Predilatation  was done with 2.5 Maverick balloon up to about 3-4 atmospheres.  Following this, the balloon was removed and a 3.0 x 15 promised drug-  eluting stent was deployed in the lesion.  After careful placement, the  stent was then deployed at 13 atmospheres.  Post dilatation was then  done with a series of inflations using a 3.25 x 12 Quantum Maverick  balloon throughout the stent.  Pressures up to a size 15 atmospheres  were utilized.  Following this, intracoronary nitroglycerin was  administered, an additional and intravascular ultrasound was performed  to document adequate stent deployment.  This was subsequently removed.  There were no complications.  All catheters were subsequently removed  and the femoral sheath was sewn into place.   ANGIOGRAPHIC DATA:  The right coronary artery is a very large-caliber  vessel with a large distal distribution.  Please see the diagnostic  report.  In the midvessel, there is a 70-75% stenosis, that looks  slightly more hazy today in the RAO view.  Following stenting and  balloon dilatation, stenosis reduced to 75 down to about 0% residual  luminal narrowing.  There was no obvious evidence  of edge tear.   Postprocedure intravascular ultrasound suggested a fair amount of plaque  distal to the stent in the vessel, but with a widely patent lumen of 3 x  3.  In the stent itself, the stent appears to be well deployed.  There  is no obvious evidence of edge tear.  There is also plaque above the  stent.   CONCLUSION:  Successful percutaneous stenting of the right coronary  artery with ultrasound-guided deployment.   DISPOSITION:  The patient will be treated medically.  We will get a  chest x-ray.  She will need aspirin and Plavix for minimum of the year  and thereafter per cardiologist's recommendations.      Arturo Morton. Riley Kill, MD, [facility]  Electronically Signed     TDS/MEDQ  D:  07/15/2007  T:  07/15/2007  Job:  [id number]   cc:   Lenon Curt. Chilton Si, M.D.  [facility]

## 2010-09-13 NOTE — Discharge Summary (Signed)
[name] [name], [name] [name] NO.:  [id number]   MEDICAL RECORD NO.:  [id number]          PATIENT TYPE:  INP   LOCATION:  1409                         FACILITY:  [facility]   PHYSICIAN:  Monte Fantasia, MD  DATE OF BIRTH:  19-Sep-1926   DATE OF ADMISSION:  07/11/2008  DATE OF DISCHARGE:  07/15/2008                               DISCHARGE SUMMARY   PRIMARY CARE PHYSICIAN:  Dr. Jacalyn Lefevre at [facility].   PRIMARY CARDIOLOGIST:  Dr. Shawnie Pons.   PRIMARY NEUROLOGIST:  Dr. Delia Heady.   DISCHARGE DIAGNOSIS:  1. Dehydration which is resolved.  2. Failure to thrive.  3. Diarrhea, which has resolved.  4. Coronary artery disease.  5. Diabetes.   MEDICATIONS UPON DISCHARGE:  1. Aspirin 325 mg p.o. daily.  2. Atenolol 75 mg p.o. daily.  3. Diazepam 5 mg p.o. daily.  4. Plavix 75 mg p.o. daily.  5. Sertraline 150 mg p.o. daily.  6. Percocet 5/500 one tablet p.o. q.6 hours p.r.n. pain.  7. Lantus 20 units subcutaneous q.h.s.  8. Lotrel 5/10 one tablet p.o. daily.  9. Multivitamins 1 tablet p.o. daily.  10.Ensure 237 mL p.o. b.i.d.  11.Nitroglycerin 1 patch to the chest wall q.6 hours.  12.Protonix 40 mg p.o. q.12.  13.Senna 1 tablet p.o. q.h.s.  14.Ambien 5 mg p.o. q.h.s.   COURSE DURING THE HOSPITAL STAY:  [name] [name] is an 77 year old  Caucasian lady patient was admitted on July 11, 2008 with complaints of  diarrhea for past 3 days with decreased p.o. intake and extremely  lethargic.  On admission the patient's Lasix was on hold and input and  output was monitored closely.  The patient was started on Ensure and  given IV fluids for the same.  The patient on admission had mild  dehydration which improved with IV fluids.  During the stay in the  hospital interim the patient had complained of chest pains and was  transferred to a telemetry bed.  Cardiology evaluation was also asked  for in view of the same.  The patient's troponin, 3 sets of cardiac  enzymes were ordered, have been negative.  With a history of coronary  artery disease and stent to the RCA in 2009.  The patient was evaluated  by cardiology.  As per the cardiology recommendations the patient would  need to have a stress test as an outpatient.  The patient was monitored  with the telemetry.  Three sets of cardiac enzymes have been negative  and the patient at present is medically stable.  As per the social  worker, the patient is being planned for a short-term skilled nursing  facility placement for rehab.   RADIOLOGICAL INVESTIGATIONS DONE DURING THE STAY IN THE HOSPITAL:  Chest  x-ray done on July 11, 2008.  Impression:  No acute cardiopulmonary  processes.   LABS DONE DURING THE STAY IN THE HOSPITAL:  Total WBC of 11.9,  hemoglobin 11.6, hematocrit 34.3, platelets of 153,000.  PT 13.9, PTT 47  an INR of 1, sodium 138, potassium 4.3, chloride 105, bicarb 27, glucose  159, BUN 15, creatinine  0.75, total bilirubin 0.5, alkaline phosphatase  62, AST 27, ALT 24, total protein 5.8, albumin 3.1, calcium of 8.6.  Cardiac enzymes times 4 sets have been negative.  UA has been negative.   EXAMINATION:  VITALS:  Today, temperature of 98.0, pulse of 59,  respirations 16, blood pressure 126/55.  NECK:  Supple.  HEENT:  Pupils equal reacting to light.  No pallor or lymphadenopathy.  RESPIRATORY:  Air entry bilaterally equal.  No rales or rhonchi.  CARDIOVASCULAR:  S1 and S2 regular rate and rhythm.  ABDOMEN:  Soft.  No guarding or rigidity.  No tenderness.  EXTREMITIES:  No edema to feet.   DISPOSITION:  The patient is awaiting the skilled nursing facility  placement for rehab purposes.  We will discharge as soon as it is  arranged for.      Monte Fantasia, MD  Electronically Signed     MP/MEDQ  D:  07/15/2008  T:  07/15/2008  Job:  [id number]

## 2010-09-13 NOTE — Assessment & Plan Note (Signed)
[facility]                            CARDIOLOGY OFFICE NOTE   NAME:CALHOUNSigourney, [name]                    MRN:          [id number]  DATE:08/02/2007                            DOB:          1926/11/26    [name] [name] is in for followup.  She underwent percutaneous stenting of  the right coronary artery with a drug-eluting stent with postprocedure  intravascular ultrasound deployment.  She has done well from a clinical  standpoint and doing much, much better.  There was a question of an  intracranial aneurysm, but followup MRI did not suggest this actually  even existed.  She also had an elevated white count in the hospital, but  her daughter tells me that the entire family has elevated white counts  and she had persistent leukocytosis of unclear etiology.  She is really  getting along quite well.  She has never been a smoker.   CURRENT MEDICATIONS:  1. Zolpidem.  2. Humulin N.  3. Atenolol 50 mg daily.  4. Furosemide 40 mg daily.  5. Multivitamin daily.  6. Humulin insulin daily.  7. Isosorbide mononitrate 60 mg daily.  8. Aspirin 325 mg daily.  9. Tramadol four times daily.  10.Diazepam once a day 5 mg.  11.Plavix 75 mg daily.  12.Zoloft 100 mg daily.  13.Amlodipine 5/20 mg one daily.   PHYSICAL EXAMINATION:  GENERAL:  She is alert and oriented.  VITAL SIGNS:  Blood pressure is 136/60, pulse is 58.  Her weight remains  stable at 163 pounds.  EXTREMITIES:  The groin has healed.  There is no obvious bruits.  LUNGS:  The lung fields are clear to auscultation and percussion.  CARDIAC:  Rhythm is regular.   Her electrocardiogram demonstrates sinus bradycardia with left axis  deviation, left bundle branch block.   IMPRESSION:  1. Coronary artery disease, status post percutaneous intervention of      the right coronary artery.  2. History of subclavian stenosis.  3. History of intracranial aneurysm not found by current magnetic      resonance  imaging.  4. History of prior transient ischemic attacks.  5. History of 40-60% right internal carotid artery stenosis in 2007.  6. Hypertension.  7. Hyperlipidemia.  8. Insulin-dependent diabetes mellitus.  9. Diabetic peripheral neuropathy.  10.Obesity.  11.Depression.  12.Leukocytosis.  13.Question history of polymyalgia rheumatica.   PLAN:  1. Continue current medical regimen which includes aspirin and Plavix      and this has been re-emphasized.  2. We will recheck her white blood cell count.  3. Follow up with Dr. Murray Hodgkins in consideration of whether or not      the patient should or should not be on lipid lowering agents which      we will defer to him.     [name] [name]. Riley Kill, MD, [facility]  Electronically Signed    [name]  [name]: 08/02/2007  [name] [name]: 08/02/2007  Job #: [id number]   cc:   Lenon Curt. Chilton Si, M.D.

## 2010-09-13 NOTE — H&P (Signed)
NAMEMYKEL, MOHL NO.:  [id number]   MEDICAL RECORD NO.:  [id number]          PATIENT TYPE:  INP   LOCATION:  6522                         FACILITY:  [facility]   PHYSICIAN:  Della Goo, M.D. DATE OF BIRTH:  [birth year]   DATE OF ADMISSION:  11/16/2008  DATE OF DISCHARGE:                              HISTORY & PHYSICAL   PRIMARY CARE PHYSICIAN:  [employer], Lenon Curt. Chilton Si, M.D.   CHIEF COMPLAINTS:  Severe headache, weakness.   HISTORY OF PRESENT ILLNESS:  This is an 77 year old female who is a  resident of the [facility] who was brought to the  emergency department for further evaluation after complaints of severe  sharp left-sided headache and reported facial drooping which had been  noticed by staff.  The patient reports having some shortness of breath  during the episode.  Denies having any chest pain.  She denies any loss  of consciousness.  She also denies having any fevers, chills, nausea,  vomiting, diarrhea or dysuria.   The patient was evaluated in the emergency department.  Her laboratory  studies revealed a hemoglobin level of 6.7 and a rectal examination was  performed by the EDP which was found to be heme-positive on fecal occult  blood testing.  The patient does report having fatigue.   PAST MEDICAL HISTORY:  Significant for:  1. Hypertension.  2. Coronary artery disease status post PTCA with stent placement times      one.  3. Type 2 diabetes mellitus.  4. Diabetic neuropathy.  5. Diabetic retinopathy.  6. Previous cerebrovascular accident with right-sided hemiparesis.  7. Degenerative joint disease.  8. Cervical disk disease.  9. The patient has peripheral vascular disease with subclavian      stenosis, carotid artery disease.   PAST SURGICAL HISTORY:  History of:  1. A total abdominal hysterectomy.  2. Appendectomy.  3. Cervical disk decompression.  4. Diskectomy with fusion.  5. Bilateral  cataract surgery with lens implantations.  6. Laser surgery to both eyes.   MEDICATION LIST:  Will be appended.   ALLERGIES:  TO PENICILLIN, CAUSING RASH.  CODEINE CAUSES GI UPSET,  HYDROCODONE CAUSES SEVERE CONFUSION AND AGITATION AND IBUPROFEN IS  MENTIONED, BUT THE REACTION IS NOT KNOWN.   SOCIAL HISTORY:  The patient is married.  She resides at the [facility] with her husband.  She is a nonsmoker, nondrinker.   FAMILY HISTORY:  Noncontributory.   PHYSICAL EXAMINATION/FINDINGS:  This is an 76 year old obese elderly  female in no visible discomfort or acute distress.  VITAL SIGNS:  Temperature 97.9, blood pressure 151/37, heart rate 74,  respirations 18, O2 saturation 94% on room air.  HEENT EXAMINATION:  Normocephalic, atraumatic.  Pupils equally round,  reactive to light.  Extraocular movements are intact.  Funduscopic  benign.  Oropharynx is clear.  NECK:  Supple full range of motion.  No thyromegaly, adenopathy or  jugular venous distention.  CARDIOVASCULAR:  Regular rate and rhythm.  No murmurs, gallops or rubs  appreciated.  LUNGS:  Clear to auscultation bilaterally.  No rales,  rhonchi or wheezes  ABDOMEN:  Positive bowel sounds, soft, nontender, nondistended.  There  is no hepatosplenomegaly.  EXTREMITIES:  Without cyanosis, clubbing or edema.  NEUROLOGIC EXAMINATION:  The patient is alert and oriented x3.  Her  cranial nerves are intact.  There is no facial drooping.  No tongue  deviation.  Motor and sensory function are grossly intact.  The patient  has mild, minimal right-sided hemiparesis.  This is chronic.  Gait was  not assessed.  There is no pronator drifting of her extremities.   LABORATORY STUDIES:  White blood cell count 14.1, hemoglobin 6.7,  hematocrit 21.3, MCV 67, platelets 173, neutrophils 54% lymphocytes 34%.  Chemistry with a sodium of 137, potassium 4.1, chloride 104, CO2 of 26,  BUN 17, creatinine 0.81 and glucose 128.  Cardiac  enzymes with a CK  total of 31, CK-MB of 1 and troponin 0.01.  Urinalysis is negative.  except for trace leukocytes.  CT scan of the head without contrast  reveals no acute intracranial abnormalities, cerebral atrophy and small-  vessel ischemic changes are present.  EKG performed reveals normal sinus  rhythm without acute ST-segment changes, LVH changes are present.   ASSESSMENT:  A 77 year old female being admitted with:  1. Gastrointestinal bleeding.  2. Microcytic anemia secondary #1.  3. Transient ischemic attack.  4. Type 2 diabetes mellitus.  5. Hypertension.  6. Coronary artery disease.   PLAN:  The patient will be admitted to telemetry area for cardiac  monitoring.  Cardiac enzymes will be performed and neurologic checks  will also be performed..  The patient will be transfused 2 units of  packed red blood cells for now and 2 units have been placed on hold as  well and her hemoglobin levels will be monitored for further changes.  The patient's regular medications have been reviewed.  Her aspirin and  Plavix therapy will be held for now secondary to the bleeding.  A GI  consultation will be requested.  The patient will be placed on IV  Protonix therapy and SCDs have been ordered for DVT prophylaxis at this  time.  Code status has been discussed with the patient and the patient  has expressed her wish not to be intubated.  However, she does wish to  have CPR and all other measures performed.  Therefore her code status  has been documented as a limited code, a do not intubate.     Della Goo, M.D.  Electronically Signed    HJ/MEDQ  D:  11/16/2008  T:  11/16/2008  Job:  [id number]

## 2010-09-13 NOTE — Op Note (Signed)
NAMEMARLEENA, [name] [name] NO.:  [id number]   MEDICAL RECORD NO.:  [id number]          PATIENT TYPE:  INP   LOCATION:  5524                         FACILITY:  [facility]   PHYSICIAN:  Graylin Shiver, M.D.   DATE OF BIRTH:  07/03/1926   DATE OF PROCEDURE:  11/18/2008  DATE OF DISCHARGE:                               OPERATIVE REPORT   INDICATIONS FOR PROCEDURE:  Heme-positive stool, anemia.   Informed consent was obtained after explanation of the risks of  bleeding, infection, and perforation.   PREMEDICATION:  See EGD for total dosage given.   PROCEDURE:  With the patient in the left lateral decubitus position, a  rectal exam was performed.  No masses were felt.  The Pentax colonoscope  was inserted into the rectum and advanced around the colon to the cecum.  Cecal landmarks were identified.  The cecum and ascending colon were  normal.  The transverse colon was normal.  In the descending colon,  there was a small 4-mm sessile polyp, biopsied off with cold forceps.  The sigmoid showed a few diverticuli.  The rectum showed a small 3-mm  polyp biopsied off with cold forceps.  Retroflexion, otherwise looked  normal in the rectum.  She tolerated the procedure well without  complications.   IMPRESSION:  1. Diverticulosis.  2. Small polyp in the descending colon.  3. Small rectal polyp.   There was nothing specifically on this exam that I would say would cause  her anemia.   I will order a CT enterography to look for a significant lesion in the  small bowel.           ______________________________  Graylin Shiver, M.D.     SFG/MEDQ  D:  11/18/2008  T:  11/18/2008  Job:  [id number]   cc:   [facility]  Lenon Curt. Chilton Si, M.D.

## 2010-09-13 NOTE — H&P (Signed)
[name] [name], SHARK NO.:  [id number]   MEDICAL RECORD NO.:  [id number]          PATIENT TYPE:  INP   LOCATION:  6735                         FACILITY:  [facility]   PHYSICIAN:  Della Goo, M.D. DATE OF BIRTH:  04-06-1927   DATE OF ADMISSION:  12/01/2006  DATE OF DISCHARGE:                              HISTORY & PHYSICAL   PRIMARY CARE PHYSICIAN:  [facility], Dr. Frederik Pear.   CHIEF COMPLAINT:  Nausea, vomiting, diarrhea.   HISTORY OF PRESENT ILLNESS:  This is a 77 year old female presenting to  the emergency department with complaints of having severe diarrhea and  nausea along with intermittent vomiting for 1 week.  She reports having  severe weakness and dizziness.  Reports having crampy abdominal pain as  well.  She denies having any fevers or chills.  She does report having  decreased p.o. intake over the past week as well.   PAST MEDICAL HISTORY:  1. Hypertension.  2. Type 2 diabetes mellitus.  3. Previous cerebrovascular accident with right-sided hemiparesis.  4. Hypertension.  5. Diabetic neuropathy.  6. Coronary artery disease/angina.  7. Previous history of polymyalgia rheumatica.  8. Cervical disk disease.  9. Degenerative joint disease.   PAST SURGICAL HISTORY:  1. History of a total abdominal hysterectomy.  2. Appendectomy.  3. Bilateral cataract surgery with lens implant.  4. Cervical decompression with diskectomy and fusion.   ALLERGIES:  To PENICILLIN causing a rash; CODEINE causes GI upset; and  IBUPROFEN.   MEDICATIONS:  1. Humulin N 100 units subcu q.a.m.  2. Humulin 50/50 insulin 60 units subcu q.p.m.  3. Aggrenox 200/35 mg one p.o. b.i.d.  4. Lotrel 5/10 mg one p.o. q.a.m.  5. Lasix 40 mg one p.o. q.a.m.  6. Imdur 30 mg one p.o. q.h.s.  7. Ultram, dose needs to be verified.  8. Diazepam 5 mg p.o. q.h.s.  9. Nitroglycerin sublingual 0.4 mg p.r.n.  10.Vicodin 5/500 mg one p.o. q.6h. p.r.n.  11.Atenolol 50 mg one  p.o. q.a.m.   SOCIAL HISTORY:  Nonsmoker, nondrinker.   FAMILY HISTORY:  Noncontributory.   PHYSICAL EXAMINATION FINDINGS:  This is a 77 year old mildly overweight,  well-nourished, well-developed female in discomfort but no acute  distress.  VITAL SIGNS:  Temperature 98.4, blood pressure 137/32, heart rate 54,  respirations 16, O2 saturations 96-100%.  HEENT: Normocephalic, atraumatic.  Pupils equally round, reactive to  light.  Extraocular muscles are intact.  Funduscopic benign.  Oropharynx  is clear.  NECK:  Supple, full range of motion.  No thyromegaly, adenopathy,  jugular venous distension.  CARDIOVASCULAR:  Regular rate and rhythm.  No murmurs, gallops or rubs.  LUNGS:  Clear to auscultation bilaterally.  ABDOMEN:  Positive bowel sounds, soft, mild diffuse tenderness.  No  rebound, no guarding.  EXTREMITIES:  Without cyanosis, clubbing or edema.  NEUROLOGIC:  Alert and oriented x3.  Cranial nerves are intact.  Mild  right-sided weakness.   LABORATORY STUDIES:  White blood cell count 18.0, hemoglobin 12.7,  hematocrit 39.5, MCV 86.7, platelets 256, neutrophils 51% lymphocytes  41%.  Sodium 140, potassium 3.7, chloride 104, carbon dioxide  25, BUN  22, creatinine 0.91, glucose 84, albumin 3.6, AST 49, ALT 40.  Urinalysis negative.  CT scan of the abdomen and pelvis negative for any  acute findings.   ASSESSMENT:  A 77 year old female being admitted with:   1. Dehydration.  2. Acute diarrheal syndrome.  3. Mild acute renal failure.  4. Type 2 diabetes mellitus.  5. Leukocytosis.   PLAN:  The patient will be placed on IV fluids for rehydration therapy.  She will continue on her regular medications.  Stool studies have been  ordered, a stool for culture and sensitivity along with C. difficile  toxin.  Her electrolytes will be monitored.  Sliding scale coverage has  also been ordered.  Her insulin dosage has been reduced at this time  secondary to her decreased p.o.  intake.  A clear liquid diet has been  ordered for now and the patient was placed on p.r.n. medications for  nausea, vomiting and diarrhea.      Della Goo, M.D.  Electronically Signed     HJ/MEDQ  D:  12/03/2006  T:  12/03/2006  Job:  [id number]   cc:   Lenon Curt. Chilton Si, M.D.

## 2010-09-13 NOTE — H&P (Signed)
[name], [name] NO.:  [id number]   MEDICAL RECORD NO.:  [id number]          PATIENT TYPE:  INP   LOCATION:  1830                         FACILITY:  [facility]   PHYSICIAN:  Marcellus Scott, MD     DATE OF BIRTH:  02-17-1927   DATE OF ADMISSION:  08/08/2007  DATE OF DISCHARGE:                              HISTORY & PHYSICAL   PRIMARY MEDICAL DOCTOR:  Bertram Millard. Hyacinth Meeker, M.D. of [facility].   PRIMARY CARDIOLOGIST:  Arturo Morton. Riley Kill, MD, [facility].   NEUROLOGIST:  Pramod P. Pearlean Brownie, M.D.   NEUROSURGEON:  Clydene Fake, M.D.   CHIEF COMPLAINT:  Bilateral upper extremity pain, neck pain.   HISTORY OF PRESENT ILLNESS:  [name] [name] is a pleasant 77 year old  Caucasian female patient with extensive past medical history, as  indicated below.  The patient indicates that she has chronic neck pain  over a few weeks, which has been slowly getting worse.  The patient went  to bed on Tuesday night when she was fine with no new complaints.  Yesterday morning, the patient woke up with pain in her left hand.  Over  the course of the day, the patient had pain which moved up to her  forearm and on to the shoulder.  She indicates that this is a stabbing  and a throbbing kind of pain, which is the worst pain she has had, which  is worse with movements and also is unable to move the arm any more  because of pain and weakness.  She denies any tingling and numbness in  that extremity.  Subsequently, last night, she also noticed a similar  kind of symptom starting in the right upper extremity and has advanced  to the right elbow. For these presentations, the patient came to the  emergency room this morning, has had extensive evaluation done,  including x-rays of the neck, MRI of the neck.  The patient denies any  history of trauma.  She has not had similar symptoms in the past.  She  denies doing any unusual weight-lifting activity or sleeping in unusual  position.  She  denies any symptoms in the lower extremities.   PAST MEDICAL HISTORY:  1. Coronary artery disease, status post percutaneous intervention with      drug-eluting stent in the right coronary artery in March, 2009.  2. Persistent leukocytosis, unclear etiology.  3. Subclavian stenosis, peripheral vascular disease.  4. History of intracranial aneurysm but negative MRI.  5. Prior TIAs or CVA with right hemiparesis, with no deficits.  6. A 40-60% right internal carotid artery stenosis on carotid Dopplers      in 2007.  7. Hypertension.  8. Hyperlipidemia.  9. Diabetes mellitus.  10.Diabetic neuropathy.  11.Obesity.  12.Polymyalgia rheumatica.  13.DJD.  14.Cervical disk disease.   PAST SURGICAL HISTORY:  1. Total abdominal hysterectomy.  2. Appendectomy.  3. Bilateral cataract surgery with intraocular placement.  4. Laser surgery of both eyes x6.  5. Cervical decompression with diskectomy and fusion of C5-6 and C6-7      and anterior cervical plate.  6. History  of surgery for kidney stones years ago.   PSYCHIATRIC HISTORY:  Depression.   ALLERGIES:  1. PENICILLIN.  2. CODEINE.  3. IBUPROFEN.  4. HYDROCODONE.   CURRENT MEDICATIONS:  1. Centrum Silver 1 daily.  2. Aspirin 325 mg, enteric coated, 1 daily.  3. Plavix 75 mg daily.  4. Amlodipine/Benazepril 5/20 mg 1 daily.  5. Atenolol 50 mg daily.  6. Humulin N as directed.  7. Furosemide 40 mg p.o. daily.  8. Sertraline 100 mg p.o. daily.  9. Tramadol 50 mg p.o. q.i.d.  10.Isosorbide mononitrate 60 mg p.o. daily.  11.Diazepam 5 mg p.o. q.p.m.  12.Humulin p.r.n.  13.Nitroglycerin p.r.n.   FAMILY HISTORY:  1. Spouse has a history of Parkinson's disease, coronary artery bypass      graft, dementia.  2. Daughter demised recently of melanoma.  3. Son is disabled when hurt in service.   SOCIAL HISTORY:  Patient is married.  She lives at her home with her  husband. She is ambulant with the help of a walker.  There is no  history  of tobacco, alcohol or drug abuse.   REVIEW OF SYSTEMS:  Fourteen systems are reviewed and apart from history  of present illness is only contributory for some chronic dyspnea but no  worse than before.  No history of chest pain, palpitations.  No fever,  chills, or rigors.   PHYSICAL EXAMINATION:  [name] [name] is a moderately built and nourished  female patient in intermittent painful distress.  VITAL SIGNS:  Temperature 98.2 degrees Fahrenheit, blood pressure 110/54  mmHg, pulse 58 per minute and regular, respirations 18, per minute.  Saturations 96% on room air.  HEENT:  Normocephalic and atraumatic.  Pupils are equal, round and  reactive to light and accommodation.  NECK:  Restricted movements but not painful with no shooting pain to the  upper extremities.  There is some left-sided neck muscle spasms  extending over to the left upper back muscles.  LYMPHATICS:  No lymphadenopathy.  BREASTS:  exam Deferred.  RESPIRATORY:  Clear to auscultation.  CARDIOVASCULAR:  S1 and S2 heard.  No S3, S4, or murmurs.  ABDOMEN:  Obese.  Nondistended, nontender.  No organomegaly or mass  appreciated.  Bowel sounds are normally heard.  CENTRAL NERVOUS SYSTEM:  Patient is awake, alert and oriented x3.  No  cranial nerve deficits.  EXTREMITIES:  Symmetrical, normal pulsations in all extremities with  normal color, no swelling.  No warmth or cold, that is, normal  temperatures.  Patient is hesitant or reluctant to move the left upper  extremity secondary to exquisite pain.  She is able to wiggle the  fingers.  Right upper extremity strength is 5/5.  Sensation is normal.  There is tenderness and spasm along the left upper back muscles and  around the shoulder.   LAB DATA:  Urinalysis is negative for features of UTI.  Cardiac enzymes  are negative.  Basic metabolic panel is unremarkable except for a  glucose of 233, BUN 14, creatinine 0.78.  CBCs with hemoglobin 11.8,  hematocrit 35.9,  white blood cells 16.3, platelets 170.   MRI of the cervical spine:  Status post C5-7 fusion without complicating  features and good decompression of the thecal sac and foramina.  Mild  spondylosis, C3-4, C4-5, without notable central canal or foraminal  stenosis.  No interval changes, small broad-based disk bulge partially  seen at the C3-4 level.   Chest x-ray:  No active disease.   X-ray of the  cervical spine:  Negative for fracture or other acute  abnormality.  Stable postoperative changes of ACDF, C5-7.   EKG, which is normal sinus rhythm with left axis deviation and left  bundle branch block, which are not new, compared to the EKG of July 16, 2007.  Otherwise, no significant changes compared to the EKG of July 16, 2007.   ASSESSMENT/PLAN:  1. Bilateral upper extremity pains, left greater than right, neck pain      (no worse): unclear etiology, question cervicogenic pain, but no      worsening of neck pain from baseline, question rotator cuff origin      but bilateralality may be against this, question spasm of the neck      muscles and left upper back muscles.  Other differential diagnosis      is thalamic stroke, but again, bilaterality against this.  Will      admit patient to telemetry.  Will cycle cardiac enzymes (clearly      does not seem cardiac, though).  Will obtain CT of the head,      noncontrast, and x-ray of the left shoulder for completion.  Will      provide IV pain medications, antispasmodics, that is, Flexeril, and      place K pad.  I have called and discussed the patient's case      including MRI findings with Dr. Phoebe Perch. He has reviewed the MRI and      indicates that there is no mechanical cause from the MRI to cause      the kind of pain that the patient has.  2. Coronary artery disease, status post percutaneous intervention:      Continue aspirin, Plavix, and beta blockers.  3. Diabetes and hypertension:  Continue home medications.  4.  Leukocytosis, chronic.  No clinical sepsis.      Marcellus Scott, MD  Electronically Signed     AH/MEDQ  D:  08/08/2007  T:  08/08/2007  Job:  [id number]   cc:   Frederica Kuster, MD  Pramod P. Pearlean Brownie, MD  Clydene Fake, M.D.  Arturo Morton. Riley Kill, MD, [facility]

## 2010-09-13 NOTE — H&P (Signed)
[facility] ADMISSION   NAME:[name] [name], [name] [name]                    MRN:          [id number]  DATE:07/10/2007                            DOB:          06-10-1926    CHIEF COMPLAINT:  Chest pain.   PRIMARY CARDIOLOGIST:  Dr. Maisie Fus D. Stuckey.   PRIMARY CARE PHYSICIAN:  Dr. Lenon Curt. Green.   NEUROLOGIST:  Dr. Clydene Fake.   HISTORY OF PRESENT ILLNESS:  This is a 77 year old married white female  patient who has a history of an apical myocardial infarction I believe  in 1998, who was catheterized by Dr. Antionette Char, although we do not  have any records.  She did not have any procedure done at that time.  She had a negative Cardiolite in 2007, that showed an old apical  myocardial infarction.  No ischemia.  She comes in today complaining of  a several-month history of progressive worsening of angina.  She  describes chest pressure across her entire chest that goes into her  neck, associated with dyspnea and diaphoresis.  Sunday was her most  severe episode, that lasted for one-half hour and was eased with four  nitroglycerin.  These have been happening weekly and have gotten  progressively worse.  Most of these episodes occur with little exertion.  On Sunday she was taking something out of the oven.  She last saw Dr.  Riley Kill in 2007, when she was requiring cervical spine surgery and she  did have an MRA of the brain that suggested a 3.5 cm aneurysm that looks  to be intra-cranial.  This was an incidental finding.  She did undergo  the neck surgery without any cardiac problems.   ALLERGIES:  CODEINE, PENICILLIN, IBUPROFEN.   CURRENT MEDICATIONS:  1. Humulin-N as directed.  2. Zolpidem 5 mg daily.  3. Amlodipine/benazepril 5/10 mg daily.  4. Atenolol 50 mg daily.  5. Furosemide 40 mg daily.  6. Multivitamin daily.  7. Humulin 50/50, about 60 units in the evening.  8. Imdur 60 mg daily.  9.  Sertraline 100 mg daily.  10.Aspirin 325 mg daily.  11.Tramadol 50 mg, four daily.  12.Diazepam 5 mg daily.   PAST MEDICAL HISTORY:  1. Significant for cervical neck surgery in 2007.  She says she has      cadaver bones in her neck.  2. She has insulin-dependent diabetes with neuropathy.  3. She has a 40%-60% right ICA.  4. History of left bundle branch block.  5. Hypertension.  No history of hyperlipidemia.  6. History of diverticulosis.  7. Obesity.  8. History of TIAs.  9. History of vertigo.  10.History of depression.   SOCIAL HISTORY:  She is married.  She has three children.  Her one  daughter just died of melanoma in 06-01-22.  Her husband is sick with  Parkinson's and it has been a troubled relationship as of lately.  She  is a nonsmoker.  Her daughter from [place] is trying to take care of  both her parents from long distance.   FAMILY HISTORY:  Significant for coronary artery disease.  Father died  at age 39, of an acute cardiac event.  Mother died at age 39, cardiac  event.  Three brothers, one died, one had heart trouble.  A sister with  a stent.   REVIEW OF SYSTEMS:  The patient gets around with a rolling sitting  walker.  She is not very active.  She wears glasses.  CARDIOPULMONARY:  See the HPI.  She denies any dizziness or pre-syncopal signs or  symptoms.  Her daughter states that when she had one of her anginal  attacks two weeks ago, she thought she may have had another TIA, as she  was not speaking clearly, but this resolved.   PHYSICAL EXAMINATION:  GENERAL:  This is a pleasant 77 year old white  female, in no acute distress.  VITAL SIGNS:  Blood pressure 141/60, pulse 59, weight 165 pounds.  HEENT:  Head normocephalic without sign of trauma.  Extraocular eye  movements intact.  Pupils equal and reactive to light and accommodation.  Nasal mucosa moist.  Throat is without erythema or exudate.  NECK:  Without jugular venous distention, HJR or bruit, no  thyroid  enlargement.  LUNGS:  Clear anterior, posterior and lateral.  HEART:  A regular rate and rhythm at 70 beats per minute.  Normal S1 and  S2.  No murmur, rub, bruit, thrill or heave noted.  ABDOMEN:  Soft without organomegaly, mass, lesion or abnormal  tenderness.  EXTREMITIES:  A trace edema bilaterally.  No clubbing, cyanosis or  edema.  She has good distal pulses.   IMPRESSION:  1. Recurrent chest pain, consistent with angina.  2. History of coronary artery disease with a previous apical      myocardial infarction and a cardiac catheterization I believe in      1998, by Dr. Aleen Campi.  3. 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  4. History of subclavian stenosis.  5. History of transient ischemic attacks.  6. A 40%-60% right internal carotid artery stenosis in 2007.  7. Status post neck surgery.  8. Hypertension.  9. Insulin-dependent diabetes mellitus.  10.Diabetic peripheral neuropathy.  11.Obesity.  12.Depression.   PLAN:  We will admit this patient to [facility] today.  She will have an MRA of the brain as recommended by Dr. Phoebe Perch, to  follow up her incidental aneurysm finding.  He feels like if it is not  changed, she is at 1%-2% risk for a year of bleed, and that it would be  okay to anticoagulate her.  He will see her tomorrow after her MRA is  performed.  Dr. Riley Kill will plan to do a cardiac catheterization  tomorrow as well.  She is agreeable.      Jacolyn Reedy, PA-C  Electronically Signed      Arturo Morton. Riley Kill, MD, [facility]  Electronically Signed   ML/MedQ  DD: 07/10/2007  DT: 07/10/2007  Job #: [id number]

## 2010-09-13 NOTE — Discharge Summary (Signed)
[name] [name], [name] [name] NO.:  [id number]   MEDICAL RECORD NO.:  [id number]          PATIENT TYPE:  INP   LOCATION:  1409                         FACILITY:  [facility]   PHYSICIAN:  Monte Fantasia, MD  DATE OF BIRTH:  05-14-1926   DATE OF ADMISSION:  07/11/2008  DATE OF DISCHARGE:  07/17/2008                               DISCHARGE SUMMARY   ADDENDUM:  Please refer to the prior discharge summary dictation on the patient's  dictated on July 15, 2008 there has been no changes in the discharge  summary dictated prior in the discharge diagnosis and discharge  medications.   COURSE DURING THE HOSPITAL STAY:  Since March 17 has been uneventful.  The patient has been taking p.o. intake and p.o. meals well. Has  received physical and occupational therapy too during the stay in the  hospital. The patient was awaiting bed availability at the skilled  nursing facility, and the patient at present is medically stable to be  discharged.  The patient denies any complaints today.   PHYSICAL EXAMINATION:  VITALS:  Temperature 97.7, pulse 54, respirations  20, blood pressure is 150/60.  HEENT: Neck is supple.  Pupils equal and reacting to light.  No pallor.  No lymphadenopathy.  RESPIRATORY:  Air entry is bilaterally equal.  No rales or rhonchi.  CARDIOVASCULAR:  S1 and S2, regular rate and rhythm.  ABDOMEN:  Soft.  No guarding or rigidity.  No tenderness.  EXTREMITIES:  No edema of feet.   DISPOSITION:  The patient is awaiting to be clear to be transferred to  the nursing home today.  The patient has bed availability today and is  medically stable for discharge.      Monte Fantasia, MD  Electronically Signed     MP/MEDQ  D:  07/17/2008  T:  07/17/2008  Job:  [id number]

## 2010-09-13 NOTE — H&P (Signed)
[name] [name], [name] [name] NO.:  [id number]   MEDICAL RECORD NO.:  [id number]          PATIENT TYPE:  EMS   LOCATION:  ED                           FACILITY:  [facility]   PHYSICIAN:  Theodosia Paling, MD    DATE OF BIRTH:  1927-03-26   DATE OF ADMISSION:  07/11/2008  DATE OF DISCHARGE:                              HISTORY & PHYSICAL   PRIMARY CARE PHYSICIAN:  Jacalyn Lefevre, M.D. at [facility].   PRIMARY CARDIOLOGIST:  Arturo Morton. Riley Kill, M.D., [facility].   PRIMARY NEUROLOGIST:  Pramod P. Pearlean Brownie, M.D.   CHIEF COMPLAINT:  Diarrhea, feeling dehydrated, and failure to thrive.   HISTORY OF PRESENT ILLNESS:  [name] [name] is a very pleasant 77 year old  lady with a history of several comorbid medical conditions.  She was  brought in here because she has been having diarrhea over the last 3  days  and has significant decreased p.o. intake, feeling extremely  lethargic and almost bedridden.  She is taken care of by her husband who  is also almost disabled, and her son, who has his own back issues.  [facility] were contacted for further evaluation and  management.   REVIEW OF SYSTEMS:  Essentially negative except for as mentioned in HPI.  In addition to that, she occasionally feels intermittent chest pain.   PAST MEDICAL HISTORY:  1. CAD, status post percutaneous intervention with drug-eluting stent      in March 2009.  2. Subclavian stenosis, peripheral vascular disease.  3. Persistent leukocytosis of unclear etiology.  4. Intracranial aneurysm.  5. History of prior TIAs and CVA with right hemiparesis and no      deficits.  6. A 40-60% right internal carotid artery stenosis on carotid duplex.  7. Hypertension.  8. Diabetes mellitus.  9. Diabetic neuropathy.  10.Obesity.  11.Polymyalgia rheumatica.  12.DJD.  13.Cervical disk disease.   PAST SURGICAL HISTORY:  1. Total abdominal hysterectomy.  2. Appendectomy.  3. Bilateral cataract surgery with  intraocular placement.  4. Laser surgery of both eyes x6.  5. Cervical decompression with diskectomy and fusion of C5-C6 and C6-      C7 and anterior cervical plate.  6. History of surgery of kidney stones a few years ago.   PSYCHIATRIC HISTORY:  Depression.   ALLERGIES:  The patient is allergic to PENICILLIN, CODEINE, IBUPROFEN,  and HYDROCODONE.   Home medications include the following:  1. Aspirin 325 mg enteric-coated p.o. daily.  2. Atenolol 50 mg p.o. daily.  3. Diazepam 5 mg p.o. daily.  4. Lasix 40 mg p.o. daily.  5. Plavix 75 mg p.o. daily.  6. Sertraline 150 mg p.o. daily.  7. Tramadol and acetaminophen p.o. q.6 h p.r.n.  8. Percocet 5/500 mg p.o. q.6 h p.r.n.  9. Lantus 60 units subcu at bedtime.  10.Lotrel 5/10 mg p.o. daily.  11.Multivitamins 1 tab p.o. daily.  12.Ambien 5 mg p.o. bedtime.  13.NovoLog insulin 15 units at breakfast, 20 units at lunch, 20 units      at dinner.   FAMILY HISTORY:  Mother and spouse with a history of Parkinson disease,  coronary artery bypass graft intervention, daughter demised recently of  melanoma, son is disabled when hurt in service.   SOCIAL HISTORY:  The patient is married, lives at home with her husband  and her son, ambulant with the help of a walker but slowly getting  almost bedridden.  No history of tobacco, alcohol or IV drug abuse.   EXAMINATION:  Temperature afebrile, blood pressure 146/94, heart rate  65, respirations 16, oxygen saturation 97% at 2 liters nasal cannula  oxygen.  GENERAL EXAMINATION:  In no acute cardiorespiratory distress.  HEENT:  Mucous membranes dry, EOM intact, no ear or nose discharge.  LUNGS:  Distant breath sounds, no rhonchi or crepitations.  CARDIOVASCULAR:  Distant heart sounds, no murmur or gallop.  GI:  Obese, soft, nontender, no organomegaly.  PSYCH:  Oriented x3.  CNS:   Follows commands.  Speech intact.  EXTREMITIES:  Trace pedal edema.   LABORATORY DATA:  WBC 15.7, hemoglobin  13.6, hematocrit 40.6, platelet  count 176.  Serum sodium 139, potassium 4.1, chloride 99, bicarbonate  30, glucose 125, BUN 22, creatinine 0.85.  AST 51, ALT 38, total  bilirubin 0.7, albumin 3.8, calcium 9.6.  Urinalysis showing small  leukocytes and few squamous epithelial cells.  Urine culture pending.   ASSESSMENT AND PLAN:  1. Dehydration:  We will gently hydrate the patient, hold Lasix, watch      I's and O's closely, no evidence of renal insufficiency at this      time.  2. Failure to thrive, the patient is extremely weak:  Will involve      PT/OT and nutrition consults.  She has significant decreased p.o.      intake as well.  3. Diarrhea:  Will send stool ova, cysts, and parasites, apparently      the patient has been having this diarrhea for several days and is      actually asymptomatic from today on.  She has not been on      antibiotic that will raise the suspicion for C. diff.  However,      will go ahead and order request for C. diff toxin.  4. Coronary artery disease at this time appears to be stable.  5. Diabetes:  Continue home regimen of Lantus and subcu NovoLog.  6. Prophylaxis:  DVT and GI involved.   CODE STATUS:  This patient is full code.   Total time spent on admission note for this patient around 45 minutes.      Theodosia Paling, MD  Electronically Signed     NP/MEDQ  D:  07/11/2008  T:  07/11/2008  Job:  [id number]   cc:   Frederica Kuster, MD  Fax: [phone]

## 2010-09-13 NOTE — Consult Note (Signed)
NAMESUBRINA, VECCHIARELLI NO.:  [id number]   MEDICAL RECORD NO.:  [id number]          PATIENT TYPE:  INP   LOCATION:  1409                         [facility]ACILITY[facility]  [facility]   PHYSICIAN:  Duke Salvia, MD, FACCDATE OF BIRTH:  [birth year]   DATE OF CONSULTATION:  07/14/2008  DATE OF DISCHARGE:                                 CONSULTATION   Thank you very much for asking [place] to see [name] [name] in  consultation for chest pain.   [name] [name] is an 77 year old woman who was admitted 3 days ago because  of dehydration secondary to diarrhea.  She is much improved in that  regard with resolution of the diarrhea and improvement in her weakness.   This morning she had an episode of chest pain which for her has been  relatively typical, occurring sporadically over the last number of  months.  She describes it as a sharp, stabbing pain just to the left of  her sternum, it lasts a number of minutes.  She received nitroglycerin  x2 and the pain went from a 10 to a zero.   As noted this has been recurrent, it is not associated with exertion.  In fact, she does not move very much at all.   It is also distinct from a chest pain syndrome that prompted admission  in March of 2009.  At that time she was found to have an RCA shelf and  she was stented and there was resolution of what was described in Dr.  Rosalyn Charters notes as grade 2 to grade 3 angina.  I should note that  Myoview scan in 2007 had demonstrated a prior apical infarct.   Over recent months, apart from the sporadic unchanged frequency of chest  pain she has noted increasing dyspnea on exertion.  She has nocturnal  dyspnea but this is resolved simply by rolling over.  She has had no  peripheral edema.  She has no history of palpitations.   On her evaluation this morning electrocardiogram was obtained that  showed a significant difference from April of 2009; this turns out to be  a lead placement error (see below).   Her other major issue is recurrent falls.  She has significant  orthostatic intolerance, says when she gets up in the morning she is  very, very dizzy and falls frequently.  She has fallen the shower.   She has been evaluated by neurology although these records are not  available.  She has been told that there is something wrong with her  legs.  She does carry a diagnosis of polymyalgia rheumatica.   PAST MEDICAL HISTORY:  In addition to the above is notable for:  1. Diabetes.  2. Hypertension.  3. Peripheral vascular disease.      a.     Subclavian stenosis.      b.     Carotid artery disease.      c.     Prior TIA and CVA for which there is supposed to be just       relatively little residual.   MEDICAL REVIEW OF SYSTEMS:  Negative apart from was described up in the  HPI and the past medical history apart from the following admissions:  1. Obesity.  2. Degenerative joint disease.  3. Cervical disks.  4. Persistent leukocytosis that is familial of unclear etiology.   I should also note that the old records described an intracranial  aneurysm, but apparently subsequent MRIs failed to corroborate this.   PAST SURGICAL HISTORY:  1. Abdominal hysterectomy.  2. Appendectomy.  3. Bilateral cataract surgery.  4. Laser surgery.  5. Cervical decompression with diskectomy.  6. History of kidney stones.   She has a psychiatric history of depression.   ALLERGIES:  PENICILLIN, CODEINE, IBUPROFEN, HYDROCODONE.   MEDICATIONS:  1. Subcutaneous heparin.  2. Protonix.  3. Aspirin 325.  4. Atenolol 50.  5. Diazepam 5.  6. Clopidogrel 75.  7. Norvasc 5.  8. Benazepril 10.  9. Ambien 5.  10.Sertraline 100.   SOCIAL HISTORY:  She is married.  She lives at home with her husband and  her son.  She gets around very little.   EXAMINATION:  She is an elderly Caucasian female lying flat in bed with  no acute distress.  Her blood pressure this morning was 158/53 with a  pulse of 58, she  was afebrile.  HEENT:  Demonstrated no drifts or xanthoma and her neck veins were flat,  the carotids were brisk and full bilaterally without bruits.  There is  modest kyphosis, there is no scoliosis.  LUNGS:  Clear, deep breath sounds were decreased.  HEART:  Sounds were regular with an S4 without murmurs.  ABDOMEN:  Soft with active bowel sounds with diffuse tenderness.  Femoral pulses were 2+, distal pulses were intact.  There is no  clubbing, cyanosis or edema.  NEUROLOGICAL:  Grossly normal in that she is able to move all her  extremities, I did not do a sensory examination.   LABORATORIES:  Notable for an initial set of enzymes this morning that  have been negative.  Her TSH was normal.  Random cortisol was also  normal.  BMET was normal with normal transaminases.   Electrocardiogram dated this morning at 8:38 demonstrates a lead  placement error with an axis shift, probably a right arm left leg error.  The anterior precordium was without change, her QRS is mildly broad.   IMPRESSION:  1. Chest pain that is largely atypical that has been occurring      sporadically at relatively regular intervals over some number of      months.  2. History of coronary artery disease with prior percutaneous coronary      intervention of her right coronary artery with:      a.     DES in March 2009 with single-vessel disease.      b.     Normal left ventricular function.      c.     Apical perfusion defect by Myoview scan 2007.  3. Left bundle branch block and left axis deviation with      electrocardiogram this morning showed an arm leg lead placement      error.  4. Recurrent falls consistent with orthostatic intolerance.  5. Progressive dyspnea on exertion over the last couple of months.  6. Peripheral vascular disease with:      a.     Prior transient ischemic attack/cerebrovascular accident.      b.     Subclavian stenosis.      c.     Carotid  disease.  7. Polymyalgia rheumatica.  8.  Diabetes.  9. Hypertension.   DISCUSSION:  [name] [name] has recurrent chest pain that is largely  atypical and is relatively sporadic and inconsistent.  I doubt that this  represents coronary disease.  I am bothered by her progressive dyspnea  on exertion and while I do not find evidence that this was part of her  ischemic manifestation in March I think it is worth exploring this and  outpatient Myoview scanning would be a reasonable consideration.   The other major symptom complex is orthostatic intolerance.  I wonder  whether this has been responsible for a number of her falls.  I think we  should go ahead and try to elucidate this.  A variety of non-  pharmaceutical maneuvers including a reverse Trendelenburg, support  stockings may help with this.  In patient's who have hypertension or  orthostatic intolerance ProAmatine can be used while the patients are  upright or Mestinon can be used in conjunction.   We will follow her along.  I think that catheterization would only be  indicated in the event that she has abnormal enzymes.      Duke Salvia, MD, [facility]  Electronically Signed     SCK/MEDQ  D:  07/14/2008  T:  07/14/2008  Job:  [id number]   cc:   Arturo Morton. Riley Kill, MD, [facility]  [address]  [address]  [place]  [place] [address]   Pramod P. Pearlean Brownie, MD  Fax: [phone]   Jacalyn Lefevre, MD

## 2010-09-13 NOTE — Assessment & Plan Note (Signed)
[facility]                            CARDIOLOGY OFFICE NOTE   NAME:CALHOUNNorleen, [name]                    MRN:          [id number]  DATE:01/15/2008                            DOB:          [birth year]    [name] is in for followup.  She has been seeing Dr. Pearlean Brownie recently.  She has had flares of her polymyalgia rheumatica with bilateral shoulder  pain.  Unfortunately, the patient has also been falling frequently.  According to her daughter, this has not been related at all to syncope  or presyncope.  However, it seems to occur when she is somewhat unsteady  on her feet.  She is probably as unsteady as her husband.  Previously,  she had class II-III angina, but that is largely resolved since the  stent was placed in her right coronary artery.  She remains on aspirin  and Plavix at the present time.  They are also concerned because she  remains weak and at times confused.   Her medications include amitriptyline 25 mg 1-1/2 tablet in each night,  NovoLog insulin, Lantus, zolpidem 5 mg nightly, atenolol 50 mg daily,  furosemide 40 mg, multivitamin daily, isosorbide mononitrate 60 mg  daily, aspirin 325 mg daily, tramadol 4 tablets daily, Plavix 75 mg  daily, amlodipine which is listed as 5/20, although her bottles are not  here, sertraline 150 mg 1-1/2 tablet daily, nitroglycerin p.r.n., and  hydrocodone.   On physical today, she is alert and oriented.  The blood pressure is  160/90 in the right arm, I can barely get it at 90 in the left arm.  I  do not hear a bruit in the left subclavian area.  The lung fields are  clear and the cardiac rhythm is regular with pulse of 61.  She appears  otherwise normal.   Electrocardiogram demonstrates left axis deviation, left bundle-branch  block with sinus rhythm.   The patient had a history of subclavian stenosis.  She has also had  internal carotid disease.  We will go ahead and get a carotid Doppler as  well as a subclavian upper extremity Dopplers bilaterally to see what  this looks like.  She will remain on her current medical regimen.  From  a cardiac standpoint, I think she is stable and her family confirms that  she has not had any of the same kind of symptoms.  She is scheduled to  have an MRI, which I think will be helpful and also an EEG with Dr.  Pearlean Brownie.  We will see her back in 2 months.     Arturo Morton. Riley Kill, MD, [facility]  Electronically Signed    TDS/MedQ  DD: 01/15/2008  DT: 01/15/2008  Job #: [id number]

## 2010-09-13 NOTE — Cardiovascular Report (Signed)
NAMEALDINE, CHAKRABORTY NO.:  [id number]   MEDICAL RECORD NO.:  [id number]          PATIENT TYPE:  INP   LOCATION:  2036                         FACILITY:  [facility]   PHYSICIAN:  Arturo Morton. Riley Kill, MD, FACCDATE OF BIRTH:  1926-07-05   DATE OF PROCEDURE:  07/11/2007  DATE OF DISCHARGE:                            CARDIAC CATHETERIZATION   INDICATIONS:  Ms. [name] [name] is a 77 year old with a history of  exertional chest tightness recently.  It has gotten slightly worse.  She  came to the office today, and her daughter brought this information to  our attention.  She also questioned whether she could have had a recent  stroke.  The current study was done to assess coronary anatomy.  Of  note, there was a previous MRI that suggests a possible 3.5-mm aneurysm.  With this in mind, we repeated the MRI which suggested no aneurysm.  Because of her symptoms, we elected to go ahead.   PROCEDURE:  1. Left heart catheterization.  2. Selective coronary arteriography.  3. Selective left ventriculography.   DESCRIPTION OF PROCEDURE:  The patient was brought to the  catheterization laboratory and prepped and draped in the usual fashion.  Through an anterior puncture, the right femoral artery was easily  entered, and a 5-French sheath was placed.  We then used a standard  Judkins catheter to inject the left coronary.  The right coronary artery  was injected with a __________ .  Central aortic and left ventricular  pressures were measured with a pigtail.  Ventriculography was then  performed in the RAO projection.  There were no complications.  The  patient was taken to the holding area in satisfactory clinical  condition.   HEMODYNAMIC DATA:  1. Central aortic pressure was 185/66.  2. Left ventricular pressure 186/90.  3. There was no gradient on pullback across the aortic valve.   ANGIOGRAPHIC DATA:  1. Ventriculography done in the RAO projection reveals vigorous global     systolic function without segmental wall motion abnormality.  2. The left main is free of critical disease.  3. The LAD courses to the apex and provides a major diagonal.  The LAD      has minimal luminal irregularity with no significant focal      stenoses.  4. There is a ramus intermedius that is without critical narrowing.  5. The circumflex consists of a marginal branch that bifurcates and is      without significant disease.  6. The right coronary artery demonstrates a fairly focal, somewhat      discrete and eccentric stenosis of 70-75% near the junction of the      proximal and midvessel.  The distal vessel provides a posterior      descending and posterolateral system, all of which appear free of      critical disease.   CONCLUSION:  1. Single-vessel coronary artery disease.  2. Well-preserved left ventricular function.   PLAN:  1. We will reassess with neurosurgery regarding whether she does or      does not have an aneurysm.  2. I will discuss  the options with the patient and her family      regarding possible percutaneous intervention and/or medical      therapy.  This will be deferred until we have the definitive      judgment on her aneurysmal situation.      Arturo Morton. Riley Kill, MD, [facility]  Electronically Signed     TDS/MEDQ  D:  07/11/2007  T:  07/12/2007  Job:  [id number]   cc:   [facility]  Lenon Curt. Chilton Si, M.D.  Clydene Fake, M.D.

## 2010-09-16 NOTE — Discharge Summary (Signed)
[facility]  Patient:    [name] [name], [name] [name]                    MRN: [id number] Adm. Date:  [id number] Disc. Date: [id number] Attending:  Terald Sleeper                           Discharge Summary  CHIEF COMPLAINT:  Dizzy and weak.  HISTORY OF PRESENT ILLNESS:  A 77 year old patient of Dr. Chilton Si, admitted though the emergency room for further treatment of weakness and dizziness, required extensive questioning in order to develop a very vague history.  The immediate problems are inability to move her head without profound dizziness and multiple falls without injury.  She has past medical history of chronic intermittent dizziness and a "swishing sensation" in her head and staggering.  When seen July 26, 1999, all the above symptoms were discussed.  Some symptoms date back over one year ago. There was no specific action taken.  She has a prior MRI of the brain in March 1999 which showed diffuse small vessel disease plus ethmoid and sphenoid sinusitis.  Other prior history is of a high sed rate to 45 that fluctuates but only 24 on July 26, 1999.  She denies headaches, visual changes (chronically complains of poor vision), diplopia, tinnitus, or ringing in her ears.  PAST MEDICAL HISTORY:  Type 2 diabetes mellitus in 1991, diabetic neuropathy in 1998, hypertension, angina, left bundle branch block, polymyalgia rheumatica, small vessel cerebrovascular disease, arthritis of the back and shoulders, renal calculi.  PAST SURGICAL HISTORY:  In 1963, appendectomy.  Hysterectomy in 1995. Cataract extractions in 1996.  Surgery on nephrolithiasis.  PHYSICAL EXAMINATION:  VITAL SIGNS:  Temperature was 98, pulse 75, respirations 20, blood pressure ___ .  GENERAL:  The patient was oriented, vague historian.  HEENT:  Essentially unremarkable with terrible dentition noted.  NECK:  Supple.  No lymphadenopathy.  No thyromegaly.  RESPIRATORY:  Exam was  clear.  BREASTS:  Without masses.  HEART:  Sounds were normal.  No murmurs.  ABDOMEN:  Diffusely tender in the lower abdomen. No masses.  CVA was not palpable.  EXTREMITIES:  Weak grip bilaterally.  Unable to left bag at her legs.  BACK:  Mild lumbosacral tenderness.  NEUROLOGICAL EXAM:  Cranial nerves II through XII intact.  Deep tendon reflexes were 2+, Babinski was downgoing. There was no nystagmus or tremor noted.  LABORATORY AND OTHER INVESTIGATIONS:  C. difficile toxin, results uncertain. Stool cultures negative.  C. difficile toxin was negative.  Urine culture negative.  Urinalysis unremarkable.  TSH 2.409, repeated at 1.819. Comprehensive metabolic panel was normal.  CK, CK-MBs, and troponin Is were all negative x 4.  ESR was 29; repeated at 37.  PT and PTT were normal.  White count on admission was 24.4, dropping down to 14.5 at discharge.  MRI of the brain, cerebral and cerebellar atrophy, advanced small vessel disease diffuse, and post contrast images are negative for acute abnormality. Slight progression of small vessel disease when compared with March 1999.  Barium enema retained feces, no obstruction of the colon.  Ultrasound of the abdomen and pelvis post hypertension.  No abnormalities. The ovaries were normal.  Transvaginal ultrasound, due to the presence of fecal material the ovaries were not visualized.  HOSPITAL COURSE:  The patient was admitted with acute dizziness. She was viewed in consultation by Dr. Orlin Hilding who felt that this did not have  clear neurologic explanation.  MRI shows old small vessel disease.  No evidence of vascular disease by Doppler or MRA.  She gave consideration to ENT evaluation, consider Plavix, physical therapy, and anticerebellar antibodies.  The only overall impression of the general medicine service who followed her was that this was some form of inner ear problem, possibly Menieres disease or labyrinthitis.  There was no  evidence of temporal arteritis noted despite her history of PMR.  It was also felt that she has hypertension and type 2 diabetes mellitus.  On Sep 16, 1999, she was ready to be discharged, however, she complained of chest pain.  Her EKG, left bundle branch block were negative.   She was kept in the hospital another day, however, it was not felt that she had active coronary artery disease.  FINAL DIAGNOSES: 1. Acute vertigo likely secondary to inner ear problems. 2. Chest pain, probably noncardiac. 3. Hypertension. 4. Type 2 diabetes mellitus with a diabetic neuropathy.  DISCHARGE MEDICATIONS:  1. Lasix 40 a day.  2. Premarin 0.625 a day.  3. Imdur 60 a day.  4. Zoloft 50 a day.  5. Atenolol 25 twice daily.  6. Vioxx 25 a day.  7. Neurontin 100 mg three times a day.  8. Lotensin 10 q.d.  9. Norvasc 10 q.d. 10. Insulin 70/30 78 units in the morning; 30 units in the p.m. 11. Elavil 10 mg at night. 12. Aspirin 81 q.d.  DISPOSITION:  She is discharged in stable condition to be followed up with Dr. Chilton Si in the office. DD:  10/04/99 TD:  10/11/99 Job: [id number]

## 2010-09-16 NOTE — Discharge Summary (Signed)
[name] [name], [name] [name] NO.:  [id number]   MEDICAL RECORD NO.:  [id number]          PATIENT TYPE:  INP   LOCATION:  5511                         FACILITY:  [facility]   PHYSICIAN:  Hillery Aldo, M.D.   DATE OF BIRTH:  10-23-1926   DATE OF ADMISSION:  09/05/2005  DATE OF DISCHARGE:                                 DISCHARGE SUMMARY   INTERIM DISCHARGE SUMMARY:   DATE OF DISCHARGE:  Pending.   PRIMARY CARE PHYSICIAN:  Dr. Murray Hodgkins.   NEUROLOGIST:  Pramod P. Pearlean Brownie, MD   DISCHARGE DIAGNOSES:  1.  Hypoglycemia.  2.  Degenerative disk disease.  3.  Radiculopathy.  4.  Herniated nucleus pulposus C5-C6, C6-C7 causing central stenosis.  5.  Diabetic neuropathy.  6.  Normocytic anemia.  7.  Urinary tract infection.  8.  Hypertension.  9.  Diabetes.   DISCHARGE MEDICATIONS:  To be dictated at the time of actual discharge.   CONSULTATIONS:  1.  Dr. Pearlean Brownie of neurology.  2.  Dr. Phoebe Perch of neurosurgery.   BRIEF ADMISSION HPI:  The patient is a 77 year old female who developed the  sudden onset of weakness on the day of admission.  She did not lose  consciousness or have any complaints of presyncopal or syncopal symptoms.  Upon arrival of EMS, her blood glucose was found to be 50.  She was  transported to the emergency department for further evaluation and workup.   PROCEDURES AND DIAGNOSTIC STUDIES:  1.  Head CT on Sep 04, 2005 showed chronic changes without evidence for acute      intracranial abnormality.  2.  Chest x-ray Sep 05, 2005 showed stable cardiomegaly without acute chest      disease.  3.  Three films of the thoracic spine on Sep 05, 2005 showed stable thoracic      spondylosis and degenerative disk disease.  No acute findings.  4.  MRI of the thoracic and lumbar spine on Sep 06, 2005 showed loss of disk      height and exaggerated kyphosis from T3 through T7.  Multiple bulging      disks and small protrusion from T3-T8.  No significant level is at  T5-T6      were a left paracentral protrusion contact to the left side of the cord.      There was abnormal cord signal or significant foraminal narrowing. Mild      right foraminal narrowing present at T4-5 due to far right lateral      protrusion. There was cervical spondylosis seen at the C5-6 and C6-7      levels. There was also degenerative disk changes in the lumbar spine.      There was mild central seen on narrowing at L3 with possible L4-L5 nerve      roots being affected.  5.  MRI of the cervical spine on Sep 08, 2005 showed cervical spondylotic      changes contributing to spinal stenosis, cord compression and foraminal      narrowing, was notable at the C5-C6 level and the C6-C7 level.   DISCHARGE LABORATORY  VALUES:  To be dictated at the time of actual  discharge.   HOSPITAL COURSE BY PROBLEM:  #1.  Sudden onset of weakness:  The patient was  admitted and acute CVA was not thought to be a component given her lack of  focal neurologic deficits.  Initially, we felt that hypoglycemia with  orthostasis and a possible urinary tract infection complicated by  polypharmacy all could be contributing factors.  Given this, her usual dose  of amitriptyline and Valium were tapered.  The patient has been evaluated  for cerebrovascular disease by Dr. Pearlean Brownie in the past and he was therefore  consulted to help provide further guidance of care.  He began her on Topamax  for pain control and suggested a neurosurgery consult for evaluation of the  MRI abnormalities as documented above.  A neurosurgical consultation was  requested and kindly provided by Dr. Phoebe Perch who felt that the patient's  sudden leg weakness was not related to her degenerative disk disease in the  cervical spine.  He felt that the patient likely had probable  vestibular/inner ear dysfunction in the setting of cerebrovascular disease  and a possible TIA that could explain her symptoms.  He also felt that a  surgical procedure  could be considered in order to stop progression  worsening of her cervical disease and she is currently considering this  though a specific decision has not been made.  The patient has not had any  sudden episode of leg weakness, although she does complain of weakness in  general.  Physical therapy was consulted and recommended either home health  PT/OT or rehabilitation on the [facility] depending on her course.  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  We will  continue the patient on Aggrenox for recurrent stroke risk reduction and  follow her course clinically until a disposition is set.  #2.  Normocytic anemia:  The patient had a mild normocytic anemia.  Iron  studies were obtained showing adequate iron stores with a total iron of 45,  total iron binding capacity 342, with 13% saturation.  Her ferritin was  slightly low at 11 so she was thought to have both anemia of chronic disease  with low iron source and was therefore put on iron supplementation.  #3.  Urinary tract infection:  The patient was started on antibiotics before  cultures could be obtained.  She completed a 3-day course of Rocephin and  followup urinalysis was negative for any signs of infection.  #4.  Hypoglycemia:  The patient did have hypoglycemia on initial  presentation.  She did not have any further episodes while in the hospital  and we managed her diabetes with sliding scale insulin as well as metformin  and NPH insulin.  #5.  Hypertension:  The patient's hypertension was controlled on her usual  home medications.   DISPOSITION:  Disposition is unset at this point.  It is likely she will  need a stay in the rehabilitation unit for ongoing physical therapy.  She is  to decide on whether or not she wants to proceed with a neurosurgical  evaluation as well.          ______________________________   Hillery Aldo, M.D.     CR/MEDQ  D:  09/10/2005  T:  09/11/2005  Job:  [id number]   cc:   Lenon Curt. Chilton Si, M.D.  Fax:  [phone]   Pramod P. Pearlean Brownie, MD  Fax: [phone]

## 2010-09-16 NOTE — H&P (Signed)
[name], [name] NO.:  [id number]   MEDICAL RECORD NO.:  [id number]          PATIENT TYPE:  EMS   LOCATION:  ED                           [facility]ACILITY:  [facility]   PHYSICIAN:  Marlan Palau, M.D.  DATE OF BIRTH:  1927/03/11   DATE OF ADMISSION:  01/20/2004  DATE OF DISCHARGE:                                HISTORY & PHYSICAL   HISTORY OF PRESENT ILLNESS:  [name] [name]. Cammack is a 77 year old, right  handed, white female, born 1926/07/24, with a history of diabetes and  hypertension.  This patient has been known to have small vessel ischemic  changes of the brain, back as far as 2001, during an admission at that time  for vertigo.  The patient has had vertigo and dizziness on and off but comes  into the emergency department today with a two day history of problems with  ambulation.  The patient denies any vertigo, however, but feels when she  gets up that she is slightly weak on the right side and feels as if she is  being pulled off to the right.  The patient notes that she would fall if she  did not hold onto something.  The patient does not report any actual loss of  vision, does note some slight right sided headache, denies speech changes,  swallowing problems, denies any actual numbness of the extremities.  Feels  that she might be a little bit weak on the right.  The patient has not had  any nausea or vomiting.  The patient's gait disturbance has persisted over  the last two days and may have worsened a little bit this morning.  Comes  into the hospital for further evaluation.  The patient noted the symptoms  upon awakening two days prior to admission.  A CT of the head was done.  It  shows small vessel ischemic changes bilaterally, particularly involving the  sub insular area in bilateral white matter in the frontal and occipital and  parietal lobes.  Neurology is called for further evaluation.   PAST MEDICAL HISTORY:  1.  New onset of slight right sided  weakness, gait disturbance.  2.  Diabetes.  3.  Hypertension.  4.  Diabetic peripheral neuropathy.  5.  Diabetic retinopathy, status post laser surgery.  6.  Obesity.  7.  Cataract surgery bilaterally.  8.  Renal calculi.  9.  History of left bundle branch block angina in the past.  10. History of vertigo.  11. History of appendectomy.  12. History of depression.   MEDICATIONS:  1.  Insulin NPH 90 units in the morning, 60 units in the evening.  2.  Lotrel 5/10 mg one a day.  3.  Atenolol 50 mg a day.  4.  Isordil 60 mg a day.  5.  Lasix 40 mg a day.  6.  Zoloft 50 mg a day.  7.  Aspirin 81 mg a day.  8.  Valium 5 mg q.h.s.  9.  Amitriptyline 75 mg q.h.s.  10. Recently started Cymbalta 60 mg a day.  11. Glucosamine.   The patient has  an allergy to:  1.  PENICILLIN.  2.  CODEINE.  3.  TALACEN.   The patient does not currently smoke or drink.   SOCIAL HISTORY:  This patient is married, lives in the [place], [place]  [place] area, has three children.  One daughter has low back pain, thyroid  disease, diabetes.  Another daughter has diabetes, COPD.  Son has back  problems.   FAMILY MEDICAL HISTORY:  Notable for the fact that her mother died with an  MI.  Father died with an MI.  The patient has nine brothers and sisters,  five of which have passed away, two with cancer, one with an MI, stroke, one  committed suicide.   REVIEW OF SYSTEMS:  Notable for no recent fevers, chills.  The patient does  note some slight headache, does note some blurring of vision, no loss of  vision, slight neck pain is noted, shortness of breath, occasional chest  pain.  The patient will take sublingual nitroglycerin at times.  The patient  denies nausea, vomiting.  Bowel or bladder incontinence is not present but  the patient has some urgency of both bowel and bladder.  Denies any black  out episodes.  The patient notes that the right side of her body feels  funny.  Feels that there might be  some slight right sided weakness.   PHYSICAL EXAMINATION:  VITAL SIGNS:  Blood pressure is 151/65, heart rate  88, respiratory rate 18, temperature afebrile.  GENERAL:  The patient is a moderately obese, white female, who is alert and  cooperative at the time of examination.  HEENT:  Head is atraumatic.  Eyes, pupils are equal, round and reactive to  light.  Disks are flat bilaterally.  NECK:  Supple.  No carotid bruits noted.  RESPIRATORY:  Clear.  CARDIOVASCULAR:  Reveals a regular rate and rhythm.  No obvious murmurs,  rubs noted.  ABDOMEN:  Reveals positive bowel sounds.  Obese abdomen noted.  No  organomegaly or tenderness is noted.  EXTREMITIES:  Reveal trace edema at the ankles bilaterally.  NEUROLOGIC:  Cranial nerves as above.  Facial symmetry is relatively intact.  The patient notes good pinprick sensation on the left face, some minimal  depression of pinprick on the lower right face, and not on the forehead.  The patient has full extraocular movements.  There is no nystagmus seen.  Visual fields are full.  __________  Speech is clearly enunciated, not  aphasic.  Motor testing reveals fairly good strength to direct testing on  all four extremities.  Good symmetric motor tone is noted throughout.  No  drift is seen.  The patient feels there may be some decrease in pinprick  sensation on the right arm, right leg as compared to the left.  Vibratory  sensation is more symmetric throughout.  No definite stocking glove pinprick  sensory deficits noted in the lower extremities.  The patient has fair  finger-to-nose-finger and toe-to-finger bilaterally.  No obvious ataxia on  one side or the other.  Deep tendon reflexes are roughly symmetric and  upgoing toe noted on the right, equivocal downgoing on the left.  The  patient is not ambulated.   LABORATORY VALUES:  Notable for a white count of 12.9, hemoglobin of 13.9, hematocrit of 42.1, platelets of 214.  Sodium 135, potassium 4.0,  chloride  of 98, CO2 of 29, glucose of 207, BUN of 13, creatinine 0.9, and calcium  9.1.  Total protein 7.0, albumin 3.7, AST of  34, ALT 26, alkaline  phosphatase 89, total bili 0.6.  INR 1.0.   CT of the head is as above.   Chest x-ray, EKG have been ordered and are pending at this time.   IMPRESSION:  New onset of gait disturbance rule out subcortical infarct.  History of feeling like she is being pulled to the right could be consistent  with a right cerebellar infarct.  This is not apparent on computed  tomography however.  Suppose there is a possibility this could represent a  medication side effect as Cymbalta was just recently started but this is not  likely.  The patient has multiple risk factors for cerebrovascular disease.   PLAN:  1.  Admission to [facility] stroke service.  2.  MRI of the brain.  3.  MRI angiogram of the intra cranial and extra cranial vessels.  4.  A 2D echocardiogram.  5.  Check blood work for lipid panel and homocystine level.  6.  The patient will receive physical and occupational therapy.  7.  Will receive IV fluids.  8.  We will change the patient over the Aggrenox.  9.  Follow the patient's clinical course while in house.       CKW/MEDQ  D:  01/20/2004  T:  01/20/2004  Job:  [id number]   cc:   Lenon Curt Chilton Si, M.D.  [address]  [place]  [place] [address]  Fax: [phone]   [facility]  [address]  [address]

## 2010-09-16 NOTE — Consult Note (Signed)
[facility]  Patient:    [name] [name], [name] [name]                    MRN: [id number] Proc. Date: 09/14/99 Adm. Date:  [id number] Attending:  Terald [name] CC:         [name] [name], M.D.                          Consultation Report  CHIEF COMPLAINT:  Tinnitus and disequilibrium.  HISTORY OF PRESENT ILLNESS:  [name] [name] is a 77 year old right handed white woman previously seen by [name] [name] for diabetic neuropathy. She has a history of about 1.5 years of intermittent tinnitus described by the patient as a whooshing noise in her head all over. It is accompanied by disequilibrium when she tries to get up and walk around. Occasionally the episodes have also been associated with slurred speech and dragging one foot according to her daughter. Generally the spells are about every other day although it may occur several times in one day. They may last about 10 minutes or so. The patient started to have a spell on Friday but it did not recede as usual and her symptoms worsened through the weekend until she felt she could not get out of bed. She does not feel dizzy per say. She denies vertigo or light headedness but has a sense of disequilibrium.  REVIEW OF SYSTEMS:  Negative for headache. She does have numbness and tingling. She does not have diplopia. She has chronic blurring of her vision.  PAST MEDICAL HISTORY:  Significant for insulin dependent diabetes for at least 20 years, heart disease with left bundle branch block, history  of polymyalgia rheumatica, status post hysterectomy. Ovaries were left in, status post bilateral tubal ligation.  MEDICATIONS:  Tylenol, Lasix, Premarin, prednisone, Imdur, Humulin, Zoloft, Tenormin, Vioxx, Neurontin, Diflucan, Norvasc and Lotensin.  ALLERGIES:  PENICILLIN AND CODEINE.  SOCIAL HISTORY:  Married, lives with her husband, no cigarette or alcohol use.  FAMILY HISTORY:  Positive for diabetes,  hypertension and coronary artery disease.  PHYSICAL EXAMINATION:  VITAL SIGNS:  Temperature 97.9, pulse 58, respirations 50, blood pressure 142/51, no orthostatics documented. CBG 168.  HEENT:  Head is normocephalic, atraumatic. It looks like she has got bilateral iridectomies.  NECK:  Supple without bruits.  HEART:  Regular rate and rhythm.  LUNGS:  Clear to auscultation.  EXTREMITIES:  Without edema.  NEUROLOGIC:  Mental status, she is awake and alert and appropriate. No obvious language errors. Did not do a formal mini mental status exam. Cranial nerves, pupils are equal and reactive although it looks like she has got iridectomies as mentioned. Visual fields are full to confrontation. Disk margins are not well seen. Extraocular movements intact. Facial sensation is normal. Facial motor activity is normal. Hearing is intact. Palate is symmetric. Tongue is midline. Motor exam, she gives poor effort in general so it is really hard to assess. She is not really making full effort with grips. They are about 4/5. There is no drift and no sateliting, no obvious weakness in the upper extremities. Likewise in the lower extremities beginning at about 5-/5 bilaterally but I dont think she is giving me full effort. Reflexes are 2+ in the upper extremities and in the knees. I cannot elicit them at the ankles. Toes are down going on the right, equivocal on the left to plantar stimulation. Finger to nose and heel to shin are normal  bilaterally. She is able to stand and will walk for me straight but says she feels like her legs "want hold her up" and want tandem. Sensory exam is decreased in multimodalities in a stocking-like distribution consistent with her neuropathy. MRI scan of the brain shows moderate subcortical and periventricular small vessel changes in the bicerebral hemispheres and also in the pons. There may be a very small left central cerebellar lacunar infarction. All of these  changes appear old. No obvious acute changes are seen on the diffusion weighted image. Vertebrobasilar system looks quite normal and patent by MR angiogram. Dopplers show no evidence of significant ICA stenosis with antegrade vertebrals. She does have quite an elevated white blood cell count I noticed.  IMPRESSION:  Tinnitus and disequilibrium, thus far no clear neurologic explanation. MRI shows old small vessel disease without acute changes. No evidence of vascular disease by Doppler or MRA in the larger vessels to account for this. Certainly she has got old vascular changes which could cause continuous symptoms but unlikely to be episodic, and she is certainly at risk for further stroke. She certainly may have a sensory ataxia as well. In light of the fact that she still has her ovaries, perineoplastic syndrome may need to be considered. These are the things that might account for disequilibrium. Orthostasis is also another possibility particularly if she has had some blood loss or is dehydrated due to chronic diarrhea.  RECOMMENDATION:  Would consider Plavix or aspirin given the MRI evidence of small vessel disease. Consider an ear, nose and throat evaluation. Would get PT to keep working with her ambulation and check antecerebellar antibodies looking for perineoplastic cerebellar syndrome. DD:  09/14/99 TD:  09/14/99 Job: [id number]

## 2010-09-16 NOTE — Consult Note (Signed)
[name] [name], [name] [name]             ACCOUNT NO.:  [id number]   MEDICAL RECORD NO.:  [id number]          PATIENT TYPE:  INP   LOCATION:  5511                         FACILITY:  [facility]   PHYSICIAN:  Pramod P. Pearlean Brownie, MD    DATE OF BIRTH:  1926/09/16   DATE OF CONSULTATION:  DATE OF DISCHARGE:                                   CONSULTATION   REFERRING PHYSICIAN:  [facility].   REASON FOR REFERRAL:  Back pain and gait difficulty.   HISTORY OF PRESENT ILLNESS:  [name] [name] is a 77 year old Caucasian lady  who is well known to me from office visits.  The patient has been  complaining of a new symptom of low back pain which has been worse for the  last 2-3 months.  She describes this has been constant and daily.  She  describes this as starting in the mid back and spreading to the sides.  She  describes this as a burning, sharp pain which is constant.  She has been  taking Ultram which seems to dull the pain.  She has also been on  amitriptyline 100 mg at night which also seems to have helped her.  The  patient was at her home standing at the sink 2 days ago when all of as  sudden she felt her legs giving out and she caught herself and did not fall  completely.  She was not feeling well for quite some time and then finally  she told her husband, who called 911 and she came to the hospital for  evaluation.  Subsequent evaluation in the hospital has shown a CT scan of  the head which has been unremarkable.  MRI scan of thoracic and cervical  spine has shown minor degenerative changes but no severe cord compression.  She has been found to have evidence of mild urinary tract infection.  She  was felt to have possible hypoglycemia at home by the EMS which seems to  have resolved.  The patient had longstanding gait difficulties secondary to  diabetic peripheral neuropathy.  She had significant paresthesias in the  past.  I have tried Neurontin in the office.  She was up to 900 mg a day  which did not work.  Subsequently, she was switched to Elavil which seemed  to be working well.  She also started having tension headaches and  increasing the Elavil seemed to have helped her recently.  I saw her in the  office about a month ago for the above complaint of back pain which at that  time she told me she had had off and on for several years but was more  pronounced for 2 months.  Her description of the pain was more muscle pull  in type and I found local tenderness in the left ribcage on exam and hence I  thought it was musculoskeletal in nature.  I ordered x-rays of the thoracic  spine which were done which ruled out any rib fracture and showed only minor  spondylytic changes.  I advised the patient to follow up with her primary  physician, Dr. Murray Hodgkins, for the above symptoms.  The patient has had  remote history of left hemispheric infarct in September 2005.  She has been  on Aggrenox since then and her vascular risk factors have included diabetes,  hypertension, obesity, and she has done well without any significant  recurrent stroke symptoms.   PAST MEDICAL HISTORY:  1.  Obesity.  2.  Diabetes.  3.  Peripheral neuropathy.  4.  Hypertension.  5.  Retinopathy.  6.  Renal calculi.  7.  Depression.   HOME MEDICATION LIST:  Aggrenox, Valium, amitriptyline, Cymbalta,  glucosamine, Lasix, isosorbide, atenolol, Lotrel, insulin.   MEDICATION ALLERGIES:  PENICILLIN and SULFA.   PAST SURGICAL HISTORY:  Cataract and laser surgery.   SOCIAL HISTORY:  She lives in [place] with her husband.  Does not smoke  or drink.  She is independent in activities of daily living.   REVIEW OF SYSTEMS:  Positive for back pain, leg pain, numbness, gait  difficulty, urinary retention, dysuria.   PHYSICAL EXAMINATION:  GENERAL:  Reveals an obese middle-aged Caucasian lady  who is not in distress who is afebrile today.  VITAL SIGNS:  Temperature 98.6, blood pressure 151/62, pulse rate  75 per  minute and regular, respiratory rate 18 per minute, saturations 97% on room  air.  VASCULAR:  Distal pulses are well felt.  HEENT:  Head is nontraumatic.  Neck supple without bruit.  ENT exam  unremarkable.  CARDIAC:  No murmur or gallop.  LUNGS:  Clear to auscultation.  ABDOMEN:  Soft, nontender.  BACK:  Examination of her thoracic region reveals some diffuse tenderness in  the flanks on either side.  I do not see any herpetic vesicles or any  specific bony tenderness in the spine.  NEUROLOGIC:  She is pleasant, awake, alert, cooperative.  Cranial nerve exam  unremarkable.  Motor system exam reveals no upper extremity drift; symmetric  strength, tone and reflexes in upper extremities.  Fine finger movements are  symmetric.  She has mild sensory ataxia in upper extremities.  There is  minimum action tremor of the outstretched upper extremities.  Lower  extremity exam reveals mild 4+/5 proximal weakness at both hips as well as 4-  /5 strength in ankle dorsiflexors bilaterally.  Plantar flexors are 4+/5.  Knee jerks can be elicited.  Ankle jerks are both absent.  Plantars are  downgoing.  She has subjective diminished position and vibration sense in  both legs from the ankle down.  She has mild hyperesthesia to touch and  pinprick in the feet.  Gait was not tested today.   DATA REVIEWED:  Multiple office notes are reviewed.  MRI scan of the spine  done yesterday reveals mild cervical spondylosis with central canal  narrowing from C5-7.  There is exaggerated kyphosis at T3-T8 in the thoracic  region without significant myelopathy.  There is foraminal narrowing at L4-5  with possible encroachment on nerve root.  The patient also had a bad UTI.  White count is slightly elevated at 12.1.   IMPRESSION:  A 77 year old lady with longstanding gait difficulties from  peripheral neuropathy with recent onset of mid back and ribcage pain of unclear etiology.  Diabetic truncal radiculopathy  is a possibility.  There  is no significant evidence of compressive myelopathy by exam or imaging  studies.  She has a longstanding mild diabetic peripheral neuropathy which  appears stable, tension headaches which are also stable.  She has remote  history of left hemispheric subcortical  infarct in September 2005 and has  been stable from a neurovascular standpoint.   PLAN:  I would recommend adding Topamax 50 mg at night for a week, increase  to 50 mg twice a day to help with paresthesias as well as back pain.  Continue amitriptyline at the present dose.  Will also ask Dr. Jacki Cones  for his neuromuscular expertise to comment on any subsequent management  tomorrow.  I do not believe any further  diagnostic neurological testing is indicated at the present time.  Continue  Aggrenox for stroke prevention with strict control of hypertension and  diabetes.  She may benefit from a physical therapy consult and perhaps a  trial of a walker and short stay in inpatient rehab.           ______________________________  Sunny Schlein. Pearlean Brownie, MD     PPS/MEDQ  D:  09/07/2005  T:  09/08/2005  Job:  [id number]   cc:   Lenon Curt. Chilton Si, M.D.  Fax: [phone]

## 2010-09-16 NOTE — Consult Note (Signed)
[facility]  Patient:    OSA, FOGARTY                    MRN: [id number] Proc. Date: 03/21/00 Adm. Date:  [id number] Attending:  Meredith Leeds CC:         Lenon Curt. Cassell Clement, M.D.   Consultation Report  HISTORY:  The patient is a 77 year old white female, type 2 diabetic, requiring insulin, with a number of other medical problems.  She has referred herself to the foot center for evaluation of pain in the feet.  There is no history of any vascular disease, ulceration of the feet, trauma to the feet. She has been on some medications for this problem, specifically including at one time amitriptyline and now is on Neurontin 100 mg three times a day; she also is on Zoloft 50 mg daily and a number of other medications including insulin, Lotrel, atenolol, Imdur, Premarin, Arthrotec, Lasix, aspirin and vitamins.  There is a history of neurological events consistent with TIA; she also had coronary disease with left bundle branch block.  PHYSICAL EXAMINATION  GENERAL:  Patient is only a little bit overweight.  EXTREMITIES:  There is no current edema of the lower extremities.  Foot temperatures are symmetric.  There are good dorsalis pedis and posterior tibial pulses bilaterally.  Skin of the feet is dry and a little bit scaly. There is protective sensation in most areas but absent on the areas under the first and third metatarsal heads and on some of the toes bilaterally.  There are no ulcers present.  There are no varicose veins or venous stasis changes present.  There is no hyperesthesia or dysesthesia of the feet.  IMPRESSION 1. Diabetes mellitus, type 2, requiring insulin. 2. Diabetic peripheral neuropathy, painful. 3. Early sympathetic and sensory neuropathy.  RECOMMENDATION 1. Patient was shown an instructional video regarding good routine foot care    for a diabetic. 2. Her footwear was inspected and found to have adequate room in  the toe box.    She is cautioned to always inspect her shoes and make sure that there are    no objects which might cause injury in them. 3. It is recommended that she use Bag Balm to help with lubrication and    moistening of her feet. 4. Return to the foot center as needed for foot or nail care. 5. No change in medical regimen is thought to be likely to be helpful for her    painful neuropathy.  It is to be expected that she will gradually have her    replacement of pain by numbness in the feet. DD:  03/21/00 TD:  03/23/00 Job: [id number]

## 2010-09-16 NOTE — Discharge Summary (Signed)
NAMEJADEN, [name] [name]             ACCOUNT NO.:  [id number]   MEDICAL RECORD NO.:  [id number]          PATIENT TYPE:  INP   LOCATION:  5511                         FACILITY:  [facility]   PHYSICIAN:  Mobolaji B. Bakare, M.D.DATE OF BIRTH:  [birth year]   DATE OF ADMISSION:  09/04/2005  DATE OF DISCHARGE:  09/16/2005                                 DISCHARGE SUMMARY   This is an addendum to an earlier dictation done by Dr. Hillery Aldo.   DISCHARGE MEDICATIONS:  1.  Metformin XR 500 mg p.o. b.i.d.  2.  Humulin N 90 units in a.m.  3.  Humulin 50/50--60 units q.h.s.  4.  Atenolol 50 mg daily.  5.  Aggrenox, one p.o. b.i.d.  6.  Lasix 40 mg daily.  7.  ___________ mg p.r.n.  8.  Tramadol 50 mg p.r.n.  9.  Sertraline 50 mg daily.  10. Lotrel 5/10 daily.  11. Questran 2 gm p.o. b.i.d. for three to five days.  12. Lactinex, one p.o. b.i.d. for seven days.  13. Flagyl 500 mg three times a day, to complete treatment on Sep 20, 2005.   ADDITIONAL PROCEDURES:  CT scan, abdomen and pelvis, showed no evidence of  colitis or enteritis.   DISCUSSION:  Please seen previous interim discharge summary for full  details.  Ms. [name] [name] continued to be hospitalized sequel to previous  interim discharge summary.  She was awaiting surgery, which was planned for  Sep 14, 2005.  She had discussion with Dr. Phoebe Perch regarding the procedure.  Please see full consultation notes dictated by Dr. Phoebe Perch in PowerChart.  In  brief, she was noted, on imaging, to have cervical stenosis and some mild  cord compression.  As per Dr. Phoebe Perch, this is probably not the etiology of  her current complaint of lower extremity weakness, but she has the potential  of deteriorating and developing symptoms from this mild cord compression.  He discussed with patient, and patient opted to go ahead and perform  surgery.  In any case, surgery was canceled because of persistent diarrhea  and leukocytosis.   Ms. [name] [name] continued to  have diarrhea on and off.  It had been fully worked  up without any etiology determined.  She had C. diff negative times four.  Fecal leukocytes were absent.  Stool culture was negative.  She was also  noted to have increasing white cell count.  Decision was made to empirically  treat for C. diff colitis which may C. diff toxin-negative.  She was started  on Flagyl and Questran.  The diarrhea improved from three to four episodes  per day to one to two semi-formed stools at the time of discharge.  This  diarrhea is of acute onset.  If it recurs, the patient would benefit from  colonoscopy.   DISCHARGE LABORATORY DATA:  Sodium 137, potassium 5.2, chloride 107, carbon  dioxide 26, BUN 9, creatinine 0.8, glucose 159, hemoglobin 11.3, hematocrit  30.9, platelets 259, white cells 15.6.      Mobolaji B. Corky Downs, M.D.  Electronically Signed     MBB/MEDQ  D:  09/24/2005  T:  09/24/2005  Job:  [id number]   cc:   Lenon Curt. Chilton Si, M.D.  Fax: [phone]

## 2010-09-16 NOTE — H&P (Signed)
[name] [name], [name] NO.:  [name]   MEDICAL RECORD NO.:  [id number]          PATIENT TYPE:  EMS   LOCATION:  MINO                         FACILITY:  [facility]   PHYSICIAN:  Elliot Cousin, M.D.    DATE OF BIRTH:  February 23, 1927   DATE OF ADMISSION:  09/05/2005  DATE OF DISCHARGE:                                HISTORY & PHYSICAL   PRIMARY CARE PHYSICIAN:  Dr. Lenon Curt. Green.   NEUROLOGIST:  Dr. Pearlean Brownie.   CHIEF COMPLAINT:  Generalized weakness, I almost passed out.   HISTORY OF PRESENT ILLNESS:  The patient is a 77 year old lady with a past  medical history significant for type 2 diabetes mellitus, cerebrovascular  disease, hypertension, and peripheral neuropathy, who presents to the  emergency department after an acute episode of becoming weak yesterday at  approximately 3 p.m.  The patient states that she was preparing lunch by the  sink.  All of a sudden, her legs became weak.  She felt lightheaded and  almost passed out.  She did not lose complete consciousness.  The patient  was able to latch onto the chair and sat down for a few minutes.  She  attempted to eat some lunch, but could not secondary to generalized  weakness.  She denies difficulty swallowing.  She was able to ambulate to  her bedroom by holding onto furniture.  After lying down for an hour or 2,  she decided to call EMS.  When EMS arrived, her capillary blood glucose was  checked; it was 50 by the patient's account (the EMS report is not  available).  The patient was apparently treated and brought to the emergency  department.  She denies unilateral weakness, slurred speech, difficulty  speaking, or visual changes.  The patient does note, however, that she was  unable to ambulate to the bathroom in time and unfortunately, urinated on  herself.  She has also had a complaint of urinary hesitancy and some  discomfort with urination for the past 2 months.  She also complains of  chronic thoracic back  pain, sometimes radiating to the lower back and to her  legs.  She has chronic stabbing pain in her back and lower legs also  associated with numbness and tingling.  She has a chronic history of  peripheral neuropathy and has been evaluated and managed by a neurologist,  Dr. Pearlean Brownie, in the past.  No recent travel.  No nausea, vomiting or diarrhea.  No fever or chills.  No increase in swelling of the legs.  No recent chest  pain or shortness of breath.  No history of loss of consciousness.  She has  had no changes in her chronic medications with the exception of an increase  in the dose of amitriptyline from 75 mg to 100 mg approximately 4 weeks ago.   During the evaluation in the emergency department, the patient is noted to  be afebrile and hemodynamically stable.  Her last data are significant for a  WBC of 15.7, a hemoglobin of 11.6, and an urinalysis positive for a trace of  leukocyte esterase.  The patient was having difficulty urinating  spontaneously and therefore a Foley catheter was inserted by the registered  nurse.  It promptly drained more than 500 mL of urine.  The CT scan of the  head revealed no acute intracranial abnormalities.  The patient will be  admitted for further evaluation and management.   PAST MEDICAL HISTORY:  1.  Chronic back pain, both thoracic and lumbar.  Also chronic complaints of      pain in both legs, thought to be secondary to peripheral neuropathy.  2.  Status post left brain stroke in September of 2005, with residual right-      sided hemiparesis.  3.  Type 2 diabetes mellitus with a history of diabetic retinopathy, status      post laser surgery and peripheral neuropathy.  4.  Hypertension.  5.  Depression/anxiety.  6.  History of angina.  Cardiac catheterization in 1998 per Dr. Aleen Campi was      negative.  7.  Status post bilateral cataract surgery and lens implantation in the      past.  8.  History of polymyalgia rheumatica in the past.  9.   History of renal calculi.  10. Status post appendectomy in the past.  11. Status post hysterectomy in the past.  12. Polypharmacy.   MEDICATION:  1.  Atenolol 50 mg daily  2.  Aggrenox 200/25 mg b.i.d.  3.  Lotrel 5/10 mg daily  4.  Metformin 1000mg  daily.  5.  Humulin N  6.  Humulin 50/50  7.  Isosorbide 60 mg daily.  8.  Cymbalta 60mg  daily.  9.  Amitriptyline 100 mg daily.  10. Diazepam 5mg  four times daily as needed.  11. Tramadol 25 mg one or two tablets every 4 hours, p.r.n.   ALLERGIES:  The patient has allergies to PENICILLIN, CODEINE, and IBUPROFEN.   SOCIAL HISTORY:  [place]  Her husband has Parkinson's disease.  The patient generally  ambulates with a cane.  She has 3 children.  Her daughter, Ginny Forth,  comes by frequently to check on her and her husband.  The patient still  drives a little.  She is a retired [employer] account planner.  She denies tobacco,  alcohol and illicit drug use.   REVIEW OF SYSTEMS:  The patient's review of systems is positive as above in  the history of present illness.  Otherwise, review of systems is negative.   PHYSICAL EXAMINATION:  VITAL SIGNS:  Temperature 99.7, blood pressure  147/74, pulse 74, respiratory rate 20, oxygen saturation 95% on room air.  GENERAL:  The patient is an alert 77 year old obese Caucasian lady who is  currently lying in bed in no acute distress.  HEENT:  Head is normocephalic and nontraumatic.  Pupils are equal, round and  reactive to light.  Extraocular movements are intact.  Conjunctivae are  clear.  Sclerae are white.  Tympanic membranes are clear bilaterally.  Nasal  mucosa is dry.  No sinus tenderness.  Oropharynx reveals fair-to-poor  dentition.  Mucous membranes are dry.  No posterior exudates or erythema.  NECK:  Supple, obese.  No bruit.  No JVD.  No adenopathy. LUNGS:  Decreased breath sounds in the bases and clear anteriorly.  HEART:  S1 and S2  with no murmurs, rubs, or gallops.  BACK:  Mild tenderness over the thoracolumbar muscles without spasm or edema  or warmth.  ABDOMEN:  Well-healed hypogastric and right lower quadrant  scars.  Abdomen  is obese.  Positive bowel sounds.  Soft, nontender and non-distended.  No  hepatosplenomegaly.  No masses palpated.  GU:  The patient has an indwelling Foley catheter draining approximately a  total of 1 L of clear-yellow urine.  RECTAL:  Deferred.  EXTREMITIES:  The patient has multiple varicosities of both lower legs.  Pedal pulses are 1+ bilaterally.  No pretibial edema.  No pedal edema.  No  acute focal joint swelling, warmth or tenderness.  She is able to raise both  of her legs just approximately 10 degrees against gravity.  She is able to  raise both of her arms approximately 90 degrees.  NEUROLOGIC:  The patient is alert and oriented x3.  Cranial nerves II-XII  are intact.  Strength is 4+/5 symmetrically of her lower extremities and 5/5  of her upper extremities symmetrically.  Sensation is grossly decreased of  her lower extremities.  The patient's speech is clear.  She follows  directions well.  Her affect is pleasant.   ADMISSION LABORATORY DATA:  Chest x-ray pending.   EKG pending.   CT scan of the head without contrast:  No acute intracranial abnormalities.  Chronic white matter disease.  Atrophy.   Urinalysis:  Urine leukocyte esterase trace.  Sodium 140, potassium 4.0,  chloride 102, CO2 27, glucose 88, BUN 15, creatinine 1.0, calcium 9.1, total  protein 6.6, albumin 3.6, AST 42, ALT 28, alkaline phosphatase 74, total  bilirubin 0.4.  WBC 15.7, neutrophils 40% and lymphocytes 56%, MCV 90,  platelets 279,000.   ASSESSMENT:  1.  Generalized weakness/presyncope.  The etiology is probably      multifactorial including hypoglycemia, orthostasis, urinary tract      infection, and side effects from multiple psychotropic medications.      Also consider a contribution from  chronic peripheral neuropathy.   1.  Hypoglycemia.  Per the patient's account, her capillary blood sugar was      50 at home.  Her venous glucose is now 88.  She is treated chronically      with multiple doses of insulin and metformin.   1.  Leukocytosis.  The patient's leukocytosis is primarily a lymphocytosis.      The etiology is unclear at this time.  She may have an urinary tract      infection;  pneumonia, and/or bronchitis will need to be ruled out.      Generally, these infections do not cause a lymphocytosis, however.   1.  Normocytic anemia.  The patient is mildly anemic, possibly secondary to      chronic disease.   1.  Chronic thoracic greater than lumbar back pain.  The patient has been      followed by neurologist, Dr. Pearlean Brownie, in the past. She is also treated by      her primary care physician, Dr. Murray Hodgkins. Her medical regimen     includes including amitriptyline, Cymbalta, diazepam and tramadol.   1.  Urinary retention/urinary frequency/mild dysuria:  The patient has      evidence of urinary retention as the Foley catheter promptly drained      greater than 500 mL of urine; the patient was unable to urinate      spontaneously. The urinary retention could be secondary to an urinary      tract infection and/or the multiple medications, particularly including      amitriptyline.   PLAN:  1.  The patient will be admitted for further evaluation  and management.  2.  We will start gentle IV fluids.  3.  We will decrease the dose of amitriptyline to 25 mg nightly.  4.  We will also decrease the dose of Valium to 5 mg nightly.  5.  Continue to treat her pain with as-needed Tylenol or oxycodone or      tramadol.  6.  We will decrease the dose of NPH to 10 units subcu q.a.m. and titrate      back up accordingly.  We will continue metformin, but will hold it for      capillary blood sugars less than 110.  We will add a modest sliding-      scale insulin regimen q.a.c. and  nightly.  7.  We will culture the urine and start antibiotic therapy with Rocephin 1 g      daily.  8.  We will assess the patient's cardiac enzymes to rule out myocardial      ischemia.  We will also assess her TSH, hemoglobin A1c, chest x-ray, and      EKG.  9.  We will check a vitamin B12, folate, and RPR.  10. We will check a thoracic x-ray to rule out compression fractures.  11. We will check carotid Dopplers to rule out peripheral vascular disease.  12. We will check orthostatic vital signs q.a.m. x3.  13. Consult Occupational and Physical Therapy.  14. Consider consulting neurologist, Dr. Pearlean Brownie.      Elliot Cousin, M.D.  Electronically Signed     DF/MEDQ  D:  09/05/2005  T:  09/05/2005  Job:  [id number]   cc:   Lenon Curt. Chilton Si, M.D.  Fax: [phone]   Pramod P. Pearlean Brownie, MD  Fax: [phone]

## 2010-09-16 NOTE — Discharge Summary (Signed)
NAMELATRENA, [name] [name] NO.:  [id number]   MEDICAL RECORD NO.:  [id number]          PATIENT TYPE:  INP   LOCATION:  3020                         FACILITY:  [facility]   PHYSICIAN:  Clydene Fake, M.D.  DATE OF BIRTH:  [birth year]   DATE OF ADMISSION:  01/11/2006  DATE OF DISCHARGE:  01/15/2006                                 DISCHARGE SUMMARY   DIAGNOSIS:  Cervical stenosis, spondylosis, and disk herniation.   DISCHARGE DIAGNOSIS:  Cervical stenosis, spondylosis, and disk herniation.   PROCEDURE:  Diskectomy and fusion of C5-6 and C6-7 __________  anterior  cervical plates.   REASON FOR ADMISSION:  The patient is a 77 year old woman with cervical  stenosis, herniated disk, spondylosis, and cord compression.  The patient  was mildly tender to palpation.  It was decided to __________ for surgery.   HOSPITAL COURSE:  The patient admitted on the day of surgery and underwent  the procedure above without complications.  Postoperatively she was  transferred to the recovery room and to the stepdown unit.  She was watched  there and continued to do well.  She moved all extremities well.  She  started getting up out of bed started ambulating.  She did have some  swelling on the second day, but no swelling of the incision had a little  white spots on her pharynx, possible thrush, was started on treatment for  that.  By January 15, 2006, the patient's swelling was much better.  She  was up ambulating and thought she was doing better than preop with her  ambulation.  The incision was clean, dry, and intact.  She was doing well  and was discharged home in stable condition.  She was setup with home-health  home PT.  Will follow up with [place] in 3 to 4 weeks.  No strenuous activity, C-  collar all the time.           ______________________________  Clydene Fake, M.D.     JRH/MEDQ  D:  02/22/2006  T:  02/23/2006  Job:  [id number]

## 2010-09-16 NOTE — H&P (Signed)
[facility]  Patient:    [name] [name], [name] [name]                    MRN: [id number] Adm. Date:  [id number] Attending:  Terald Sleeper                         History and Physical  CHIEF COMPLAINT:  Dizzy and weak x 4 days and getting worse.  HISTORY OF PRESENT ILLNESS:  The patient is a 77 year old white female, followed by Dr. Murray Hodgkins as an outpatient and is admitted through the emergency room at [facility] for further treatment and evaluation of weakness and dizziness.  The patient is a poor historian and required extensive questioning in order to develop what was a very vague history. Immediate problems are an inability to move her head without profound dizziness, multiple falls without injury and feeling so weak that she cannot sit up.  While in the emergency room, the emergency department physician also noted a white count of 24,000 and a low blood pressure.  PAST MEDICAL HISTORY:  Her past medical history contains reference to the chronic intermittent dizziness, a "swishing sound" in the ears, staggering, off balance feelings, nausea, diarrhea, aching in her chest.  The patient was last seen at the office July 26, 1999, when all the above symptoms were discussed.  The patient presents again to the emergency room today with many of the above symptoms.  Some symptoms actually date back over a year ago.  No specific action in regards to these symptoms were taken at the time of her office visit.  Prior MRI of the brain, March 1999 showed diffuse small vessel disease plus ethmoid and sphenoid sinusitis.  Other prior history of this patient includes a high sedimentation rate to 45 that fluctuates, but was only 24 on July 26, 1999, and has been attributed to possible polymyalgia rheumatica.  The patient currently denies any headache, visual change (she is chronically complaining of "poor vision."), diplopia, tenderness or ringing in the  ears. She notes a "new" weakness of the left leg approximately one week ago but does not think that this contributed to her falls and first blamed the "off balance" and dizzy feelings as the cause of her falls.  The patient did have an episode, about a week ago, of slurred speech according to her daughters who accompany her to the hospital.  The patient did not recall this initially.  Past history of insulin-dependent diabetes mellitus since 1971, diabetic neuropathy since 1988.  Hypertension, anginal, left bundle-branch block. Chronic depression and polymyalgia rheumatica.  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  Arthritis of the back, shoulders and knees.  History of renal calculi, recurrent diagnosis.  PAST SURGICAL HISTORY:  In 1963 appendectomy and hysterectomy.  In 1995 cataract extractions and lens implants bilaterally.  November 1996 removal of a kidney stone.  PROCEDURES:  April 1998, radionuclear angiogram showed diminished perfusion of the diaphragmatic wall.  Next, on April 1998, heart catheterization by Dr. Charolette Child was interpreted as normal.  December 1998, ultrasound of the gallbladder normal.  December 1998, upper GI series.  Hiatal hernia.  March 1999, MRI of the brain, undisclosed small vessel disease of the diffuse nature, sinusitis of the ethmoid and sphenoid.  May 1999, peripheral nerve conduction velocity/EMG undisclosed sensory neuropathy of the upper and lower extremities.  CONSULTATIONS:  Drs. Annabell Howells, urology, [place], podiatry, Tracey Harries cardiology, Nile Riggs, ophthalmology,  Roslyn Smiling, gynecology, and [place], neurology.  ALLERGIES:  PENICILLIN, rash.  CODEINE, nausea.  TALACEN, nausea.  IMMUNIZATIONS:  Pneumonia vaccine in 1997.  Tetanus diptheria, January 1999.  MEDICATIONS:  1. Lasix 40 mg daily.  2. Premarin 0.625 mg daily.  3. Prednisone 5 mg daily.  4. Imdur 60 mg daily.  5. Humulin 70/30 100 units a.c. breakfast and  40 units a.c. supper.  6. Zoloft 50 mg daily.  7. Atenolol 50 mg daily.  8. Tylenol as needed.  9. Vioxx 25 mg daily. 10. Lotrel 5/10 one daily. 11. Neurontin 100 mg t.i.d.  FAMILY HISTORY:  Father died, age 49 of myocardial infarction and also known to have hypertension.  Mother died, age 90, myocardial infarction with prior history of diabetes mellitus and hypertension.  Three brothers, one died of cancer, one with diabetes mellitus and hypertension, one living with heart problem, hypertension and nerves.  Five sisters, two deceased, one of suicide and one of CVA, one living with tuberculosis, diabetes, asthma, one diabetic and one hypertensive.  Three children, two females, one with nervous condition, one son with back pain.  SOCIAL HISTORY:  She lives with her husband and mother-in-law.  Stressed home situation with her demented mother-in-law creating some disruption, nocturnal awakenings.  The patient does not use alcohol or tobacco.  REVIEW OF SYSTEMS:  GENERAL:  She denies sweats, fevers or chills.  Weight has been stable to gaining somewhat, moving from 159 up to 164 pounds over the last year.  SKIN:  Sensitive skin on the feet, however, no rashes or other problems.  HEAD, EYES, EARS, NOSE, AND THROAT:  Her hearing appears to be normal.  No burning of her mouth.  Denies any stinging of her tongue.  Taste normal.  CARDIOPULMONARY:  Recurrent central chest discomfort.  Some dyspnea on exertion.  Denies orthopnea.  No recent cough.  No hemoptysis. GASTROINTESTINAL:  Denies reflux but has recurrent nausea spells.  She has "diarrhea" consisting of about three of four stools per days.  She denies any formed stool.  Her stool stays "mushy."   There has been no blood in the stool.  She denies any cramps with stooling.  GENITOURINARY:  There is some urgency but she denies dysuria, hematuria.  She denies any vaginal discharge or burning the vaginal area.  Denies any pain in this area.   Painful lower abdominal area on physical examination seemed to create some surprise on the  patients part that she was actually hurting in that area.  MUSCULOSKELETAL: Diffuse pains in back, knees, neck, and shoulders.  Nothing of any particular focal nature in this and these are chronic.  NEUROLOGIC:  There is some burning and pain in her legs and feet.  Also see history of present illness.  PHYSICAL EXAMINATION:  VITAL SIGNS:  Temperature 98, pulse 75, respirations 20, blood pressure 200/80 with one episode of 80/40 noted on a monitor which may have been cuff problems while in the emergency room.  GENERAL:  She is alert, oriented, a vague historian but cooperative throughout the examination.  No evidence of pallor.  She does appear acutely distressed by the problems of moving her head without significant dizziness.  HEENT:  Pupils, equal, round, reactive to light and accommodation. Extraocular lenses are present bilaterally.  Fundi appear normal.  There is no nystagmus.  Sclerae are white.  TMs, EACs and hearing grossly normal. Oropharynx shows terrible dentition with multiple caries but no inflamed areas.  NECK:  Supple.  No thyromegaly, nodule,  mass, or bruit.  CHEST:  Clear to auscultation and percussion.  BREASTS:  Nontender without nodules.  HEART:  Regular sinus rhythm.  No gallop, murmur, click, or rub.  PMI left fifth intercostal space in midclavicular line.  ABDOMEN:  Diffusely tender throughout the lower abdomen.  No organomegaly, masses or bruits.  Costovertebral angle tenderness, nontender.  PELVIC:  Erythematous labia and a curdy discharge is present.  There is discomfort on bimanual examination but no evidence of a mass.  RECTAL:  Increased sphincter tone, formed stool at tip of finger, heme-negative.  No palpable masses.  EXTREMITIES:  There is a weak grip bilaterally and she appears to be unable to lift her legs more than about six inches above the exam  table.  When asked to try to do better she simply tightens up her muscles in her legs and I am really not convinced that her efforts on strength testing were commensurate with her desire to show how weak she was.  There is no edema, venous cords, or swollen joints.  Joint range of motion appears to be full and unrestricted.  BACK:  Mild lumbosacral tenderness.  NEUROLOGICAL:  Cranial nerves appear to be intact.  She is very dizzy with head movement.  However, there is no evidence of nystagmus and no evidence of tremor, diadochokinesia or pass pointing.  Babinski shows downgoing greater toes bilaterally.  Deep tendon reflexes are approximately +2 and some atrophy.   Pulses +4 and symmetric at dorsalis pedis and posterior tibials.  IMPRESSION: 1. Dizziness.  I think this likely may be an acute ______ but findings of    weakness in the legs, history of speech impediment which seems to have    cleared up and worsening of this situation would make me want to rule out    cerebrovascular accident.  A computed tomographic scan in the emergency    room was unremarkable, however, I believe she needs to have a followup    magnetic resonance imaging and possible magnetic resonance angiogram.  I    doubt a brain stem infarct, but cannot totally rule this out at the present    time, particularly with the presence of prior small vessel disease. 2. She has hypertension with fluctuation which needs further monitoring. 3. Diabetes mellitus, needs further monitoring. 4. Elevated white blood cells as I explained at the present time. 5. Chronic diarrhea and lower abdominal pain, also remains unexplained.  PLAN:  MRI of the brain, barium enema, ultrasound of the pelvis.  Will follow up with sedimentation rate, monitor diabetes mellitus.  Need to consider a possible need for physical therapy and occupational therapy depending upon whether or not her dizziness seems to clear up quickly.   Neurologic consultation should be obtained.  The patient has previously been seen by Dr. Tomasa Rand.  DD:  09/14/99 TD:  09/14/99 Job: [id number]

## 2010-09-16 NOTE — Consult Note (Signed)
[name] [name], [name] [name] NO.:  [id number]   MEDICAL RECORD NO.:  [id number]          PATIENT TYPE:  INP   LOCATION:  5511                         FACILITY:  [facility]   PHYSICIAN:  Clydene Fake, M.D.  DATE OF BIRTH:  April 03, 1927   DATE OF CONSULTATION:  09/09/2005  DATE OF DISCHARGE:                                   CONSULTATION   REFERRING PHYSICIAN:  Hillery Aldo, M.D.   REASON FOR CONSULTATION:  Evaluate for possible cervical myelopathy.   HISTORY OF PRESENT ILLNESS:  The patient is a 77 year old lady who has had  prior infarcts with residual right-sided weakness and is a diabetic and has  diabetic peripheral neuropathy.  She has had some chronic thoracic and  lumbar pain problems.  She has been having some issues with her legs,  getting weak and tiring as she walks and needing to sit down and rest before  she can walk some more, over the last few months.  On the day of admission,  her legs just gave out and she fell.  When she came in, she talked about  almost passing out and then falling, although currently she says she did not  pass out at all, she knew what was going on and that her legs just gave out  on her.  Right after that, she had trouble moving her legs and was brought  to the hospital by ambulance.  Neurology was consulted.  Workup ensued  including MRIs of the thoracic and lumbar spine.  Scout films showed some  possible cervical problems.  A cervical MRI was done and we were called for  evaluation.  The patient has had some improvement and has walked a little  bit with a walker, but feels weak.  She does have some trouble doing small  fine movements with her hands, but has not noticed this has been  progressive.  She can do buttons and can eat with a fork.  She does walk  like a duck wide-based at baseline, but has never noticed much jumpiness  in her legs.  There is no electric shock shooting through her neck, spine,  arms or legs.  When she  hyperextends, she gets dizzy, but it has never  caused neck pain or other problems.  She does have a little neck ache, but  the thoracic and lumbar pain is worse.  No pain or numbness radiating down  the arms.   PAST MEDICAL HISTORY:  1.  Type 2 diabetes.  2.  Diabetic retinopathy.  3.  Peripheral neuropathy.  4.  Hypertension.  5.  Depression/anxiety.  6.  History of angina.   PAST SURGICAL HISTORY:  1.  Cataract surgery.  2.  Appendectomy.  3.  Hysterectomy.   MEDICATIONS:  Atenolol, Aggrenox, Lotrel, metformin, Humulin, isosorbide,  Cymbalta, amitriptyline, Diazepam, Tramadol.  She has been started on  Topamax since.   ALLERGIES:  PENICILLIN, CODEINE, IBUPROFEN.   SOCIAL HISTORY:  She lives with her husband.  She does not use tobacco or  alcohol.   FAMILY HISTORY:  Noncontributory.   REVIEW OF SYSTEMS:  Otherwise  negative other than as mentioned above.   PHYSICAL EXAMINATION:  GENERAL:  Awake, alert, pleasant.  NECK:  Range of motion of the neck with flexion and extension.  In  extension, she does complain of feeling dizzy or woozy.  NEUROLOGIC:  There is no pronator drift.  Rapid alternating movements are  slowed bilaterally.  Deep tendon reflexes are 3+/4 on right and 3/4 on left.  No Hoffmann.  Motor strength and sensation grossly intact.  There is good  individual motor strength in the lower extremities.  Toes are equivocal on  the right and downgoing on the left.  There is no clonus.  The reflexes are  brisk bilaterally at 4-/4.  Gait not tested.  She needs to use a walker.   LABORATORY DATA AND X-RAY FINDINGS:  MRI spine shows some degenerative  change. There is some narrowing at L4-L5 with mild stenosis, but nothing  that can be causing the patient's symptoms.  No surgical intervention or  thoracic spine.  Generalized kyphosis deformity with spondylytic changes.  There are a couple areas of spondylytic burs, but no cord compression.  There is fluid around the  levels and no cord change.  Cervical spine MRI  shows disc herniations at C5-C6 and C6-C7 and causing some anterior  compression of the cord with fluid posterior to the cord at both of these  levels and no cord changes seen.  Other levels look good.   ASSESSMENT:  The patient with some cervical stenosis, some mild cord  compression, but no cord change seen and there is still some cerebral spinal  fluid around the cord at the C5-C6 and C6-C7 levels.  With her medical  history of previous cerebral infarctions and peripheral neuropathy, it is  hard to distinguish what symptoms are coming from what.  It is not typical  for all of a sudden leg weakness to be brought on suddenly from spinal  atelectatic changes or myelopathy.  It is usually a slow, progressive  problem of clumsiness in coordination and spasticity of the upper and lower  extremities.   RECOMMENDATIONS:  Treatment for the problem with diskectomy and relief of  pressure on spinal cord is done in most cases to stop any progression of the  problem and given the possibility of some improvement, although some people  continue with cord atrophy and problems.  Although her cervical stenosis is  likely to worsen over time and can become more and more symptomatic, again I  am not sure how to explain how it is related with her current reason for  being in the hospital.  Because of the tightness there, we could consider  anterior cervical diskectomy fusion.  We went over the procedure with the  patient.  She gives a history of possibly having a urinary tract infection  and we will need to get repeat urinalysis and cultures to make sure that is  not going on.  She needs to be off anticoagulants for at least 5-7 days and  she would be ready for rehabilitation probably the day following surgery.  We could follow and watch serial exams and serial magnetic resonance imaging and see what happens over time and let her continue with the course she  is  under with physical therapy and plan on getting her to rehabilitation soon.  The patient was to consider her options and we will talk with her some more  as we follow her through her hospital stay.  ______________________________  Clydene Fake, M.D.     JRH/MEDQ  D:  09/09/2005  T:  09/11/2005  Job:  [id number]

## 2010-09-16 NOTE — Op Note (Signed)
[name], [name] NO.:  [id number]   MEDICAL RECORD NO.:  [id number]          PATIENT TYPE:  INP   LOCATION:  3172                         FACILITY:  [facility]   PHYSICIAN:  Clydene Fake, M.D.  DATE OF BIRTH:  17-Jun-1926   DATE OF PROCEDURE:  01/11/2006  DATE OF DISCHARGE:                                 OPERATIVE REPORT   DIAGNOSIS:  Herniated nucleus pulposus spondylosis with stenosis and  myelopathy at C5-6 and C6-7.   POSTOPERATIVE DIAGNOSIS:  Herniated nucleus pulposus spondylosis with  stenosis and myelopathy at C5-6 and C6-7.   PROCEDURE:  Anterior cervical decompression and diskectomy and fusion at C5-  6 and C6-7 with LifeNet bone and Eagle anterior cervical plate.   SURGEON:  Clydene Fake, M.D.   ASSISTANT:  Lovell Sheehan.   ANESTHESIA:  General endotracheal tube.   ESTIMATED BLOOD LOSS:  Minimal.   BLOOD REPLACED:  None.   DRAINS:  None.   COMPLICATIONS:  None.   REASON FOR PROCEDURE:  The patient is a 77 year old woman who was found to  have cervical stenosis, herniated nucleus pulposus, C5-6 and C6-7 with cord  compression.  It was decided to electively decompress the cord with ACF when  patient medically stable.  The patient has been cleared cardiac wise and  medical wise, brought in for decompression of the cervical spine.   PROCEDURE IN DETAIL:  The patient was brought into the operating room,  general anesthesia was induced.  The patient was placed in 5-pound shoulder  traction, prepped and draped in a sterile fashion.  This incision was  injected with 10 mL of 1% lidocaine with epinephrine.  Incision was then  made from the midline to the anterior border of the sternocleidomastoid  muscle on the left side of the incision, taken down to the platysma and  hemostasis was obtained with Bovie cauterization.  The platysma was incised  with the Bovie and blunt dissection taken through the anterior cervical  fascia to the anterior  cervical spine.  A needle was placed in an  interspace.  X-rays were obtained showing this was the C5-6 interspace.  Disk space was incised with a 15-blade and diskectomy started with pituitary  rongeurs after the needle was removed.  Longus coli muscle was reflected  laterally using the Bovie from C5 through S7 bilaterally and a self-  retaining retractor was placed.  The disk space was irrigated and incised  with a 15-blade and diskectomy started with pituitary rongeurs and curetted  at the 5-6 and 6-7 level.  Anterior osteophytes removed with Kerrison  punches and the distraction pins were placed in the C5 and C7 interspaces  distracted.  Microscope was brought in for microdissection.  Diskectomy was  then continued to the 5-6 level and curettes, pituitary rongeurs, and a high  speed drill were used to remove osteophytes and __________ end plate with  posterior osteophytes.   A Kerrison punch was then used to finish the posterior decompression,  removing herniated disk, posterior ligament, and perform bilateral  foraminotomies.  There appeared to be a good central decompression of the  cord and bilateral foramen were opened with decompression of the nerve  roots.  Measured the disk space to be 5  mm and after Gelfoam and thrombin  in the area for hemostasis, this was irrigated out, a 5-mm __________  was  tapped into place, countersunk about a mm, and there was plenty of posterior  graft between bone graft and the dura.  Attention was then taken out to T6-  7.  Again the interspace was drilled with the anterior osteophytes and  cartilaginous end plate and posterior osteophytes with a high speed drill  and 2-mm Kerrison punches were used to complete the diskectomy, removing  posterior osteophytes, disk herniation ligaments, and performing bilateral  foraminotomies.  There is a large calcification at that disk into the right  side that was removed into the right paracentral area.  There  appeared to be  a good central decompression, with the central canal and spinal cord had  bilateral foraminal opening with decompression of the nerve roots.  After  hemostasis with Gelfoam and thrombin this was irrigated out and we measured  the disk space to be 6 mm and a 6-mm length allograft bone was tapped into  place, countersunk about a mm.  We checked posterior to the graft and there  was plenty of room between the bone graft and dura.  We irrigated with  normal saline solution.  We removed the distraction pins and got hemostasis  with the Gelfoam and thrombin, removed the weight from the traction.  The  bones were permanently in place.  An Eagle anterior cervical plate was  placed over the anterior cervical spine, 2 screws placed in the C6 and 2 in  the C7.  These were tightened down and lateral x-rays were obtained, showing  plate and screws in C5 and in the interbody graft of C5-6, but because of  the shoulders, we could not see anything further down.  Intraoperatively it  looked good.  The wound was irrigated with antibiotic solution and  retractors removed.  We had good hemostasis and the platysma was closed with  3-0 Vicryl interrupted sutures, subcutaneous tissues closed with the same,  skin closed with benzoin and Steri-Strips, dressing was applied.  The  patient was placed in a soft cervical collar, awoken from anesthesia, and  transferred to the recovery room in stable condition.           ______________________________  Clydene Fake, M.D.     JRH/MEDQ  D:  01/11/2006  T:  01/11/2006  Job:  [id number]

## 2010-09-16 NOTE — Discharge Summary (Signed)
[name] [name], [name] NO.:  [id number]   MEDICAL RECORD NO.:  [id number]          PATIENT TYPE:  INP   LOCATION:  3017                         FACILITY:  [facility]   PHYSICIAN:  Annie Main, N.P.      DATE OF BIRTH:  12/06/1926   DATE OF ADMISSION:  01/20/2004  DATE OF DISCHARGE:  01/22/2004                                 DISCHARGE SUMMARY   DISCHARGE DIAGNOSES:  1.  Small left hemispheric infarct not seen on MRI.  2.  Diabetes.  3.  Hypertension.  4.  Obesity.  5.  Diabetic peripheral neuropathy.  6.  Diabetic retinopathy, status post laser surgery.  7.  Cataract surgery bilaterally.  8.  Renal calculi.  9.  History of left bundle branch block.  10. Angina in the past.  11. History of vertigo.  12. History of appendectomy.  13. Depression.   DISCHARGE MEDICATIONS:  1.  Aggrenox one p.o. every day x 14 days and then increase to b.i.d.  2.  Lotrel 5/10 mg a day.  3.  Tenormin 50 mg every day.  4.  Valium 5 mg at bedtime.  5.  Lasix 40 mg every day.  6.  Isordil 60 mg a day.  7.  Zoloft 50 mg a day.  8.  Elavil 75 mg at bedtime.  9.  Insulin 90 units NPH q.a.m., 60 units of 50/50 q.p.m.  10. Cymbalta 60 mg a day.  11. __________- as prior to admission.   STUDIES PERFORMED:  1.  CT of the brain on admission.  She has mild to moderate diffuse cerebral      and cerebellar atrophy.  Moderate small vessel disease with white matter      changes in both cerebral hemispheres, old left basal ganglia lacunar      infarct.  2.  Chest x-ray shows mild cardiomegaly, no active disease.  3.  MRI shows no acute stroke.  Formal interpretation pending.  4.  A 2-D echocardiogram shows ejection fraction of 55-65% with no LV wall      abnormalities, no obvious enlarged source though a technically difficult      study.  5.  Carotid Doppler is normal.  6.  Transcranial Doppler is performed; results pending.  7.  Electrocardiogram shows normal sinus rhythm with left axis  deviation,      left bundle branch block.   LABORATORY DATA:  Hemoglobin A1c 8.1.  Cardiac enzymes negative.  Homocystine 10.08.  Lipid profile:  Cholesterol 132, triglycerides 113, HDL  31 and LDL 78.  CBC:  Hemoglobin 13.9, hematocrit 42.1, white blood cells  12.9, platelets 214,000, MCV 95.3.  Differential with elevated absolute  lymphocytes at 4.6 and absolute monocytes at 1.0, otherwise negative.  Chemistry:  Sodium 135, potassium 4.0, chloride 98, CO2 29, BUN 13,  creatinine 0.9 and glucose 207.  Coagulation studies were normal on  admission.  Liver function tests were normal.  Albumin is normal.   HISTORY OF PRESENT ILLNESS:  Ms. [name] [name] is a 77 year old right-  handed white female with a history of diabetes and hypertension who was  found to have small vessel ischemic changes and a history of stroke I think  in 2001.  The patient does have a history of vertigo and dizziness that  comes and goes but on the day of admission has a two day history of problems  with ambulation with mild vertigo.  She says she feels slightly weak on the  right side and she feels she has been pulling to the right.  The patient  notes she would fall if she did not hold on to something.  She does not  report any loss of vision, headache, swallowing problems, or any numbness in  the extremities.  She feels like her gait disturbance may have worsened this  morning.  She comes into the hospital for further evaluation.  A CT of the  head shows small vessel ischemic changes bilaterally, particularly involving  the subinferior area in the bilateral white matter in the frontal and  occipital and parietal lobes.  She will admitted to the hospital for further  evaluation.  She is not a t-PA or SAINT II candidate secondary to time.   HOSPITAL COURSE:  An MRI was unrevealing for acute infarct.  It was felt  that the infarct may be small enough that it was not able to be seen on MRI.  However, her exam is  slightly inconsistent with some give way weakness on  the right.  Her gait does remain mildly ataxic, and she has some mild  decreased sensory deficits on the right.  Rehabilitation was recommended,  but the patient prefers to go home so we will discharge her home with home  health PT, OT and no equipment for her.   Her diabetes was found to be poorly controlled with a hemoglobin A1c of 8.1.  The patient states that this has been a problem for her and she has a hard  time controlling her sugars but Dr. Chilton Si ____________.  She will continue  followup with him for her diabetes as well as hypertension, obesity.  No  other risk factors identified.   DISCHARGE CONDITION:  The patient is alert and oriented x3.  Speech is  clear.  No aphasia.  Visual fields are full.  No facial weakness.  Her chest  is clear to auscultation.  Her heart rate is regular.  She is able to follow  commands.  She has mild right 4+/5 hemiparesis on the right with give way  weakness on the right upper extremity.  She reports decreased sensation on  the right.  Her fine motor movements are okay.   DISCHARGE PLAN:  1.  Discharge home with husband.  Daughter to assist with 24 hour care      except during the night when she will be alone.  2.  Will get home health PT and OT through [facility] and rolling      walker with 5 inch wheels and 3-in-1 for home use.  3.  Follow up with Dr. Chilton Si within one month.  4.  Follow up with Dr. Pearlean Brownie within 2-3 months.  5.  Aggrenox for secondary stroke prevention.  We will start at one and      increase to two for headache prevention.       SB/MEDQ  D:  01/22/2004  T:  01/23/2004  Job:  [id number]   cc:   Lenon Curt. Chilton Si, M.D.  [address]  [place]  [place] [address]  Fax: [phone]

## 2010-09-16 NOTE — Assessment & Plan Note (Signed)
[facility]                              CARDIOLOGY OFFICE NOTE   NAME:CALHOUNMaranda, Marte                    MRN:          [id number]  DATE:12/05/2005                            DOB:          1926/08/02    We were able to obtain information regarding Ms Osinski.  She has been  referred for a preoperative evaluation.  The patient has a long history of  diabetes mellitus, and does have a history of prior evaluation.  The patient  did undergo cardiac catheterization in April of 1998.  At that time, the  patient had evidence of no significant coronary obstruction.  She also was  admitted by Dr. Pollyann Kennedy in 1993, and did not appear to have an infarct at that  time.  Dr. Pearlean Brownie did Dopplers in September of 2005, and there was no  significant ICA stenosis, and vertebral flow was antegrade bilaterally.  Transcranial Doppler was done at that time as well.  Repeat carotid duplex  studies were done in May 2007.  At that time there was heterogeneous plaque  with a 40% to 60% ICA stenosis on the right.  The vertebral flow was  antegrade on both sides, and there was no significant ICA stenosis on the  left.  The patient also had an echocardiogram done in September of 2005, and  at that time had an ejection fraction of 55% to 65%.  This was a somewhat  technically limited study.  There was no significant pericardial effusion,  and the right-sided chamber sizes otherwise appeared to be normal.   The patient does get some shortness of breath with exertion, and  occasionally does have some dizziness.   PHYSICAL EXAMINATION:  The blood pressure is 150/73, pulse is 60, weight is  165 pounds.  The lung fields are clear, and the cardiac rhythm is regular.   The patient has an underlying left bundle branch block.  She is not having  critical symptoms at this time.  She does need to have surgery, and it is  not a minor procedure.  At the present time, I do think it would be  most  appropriate to proceed with a myocardial perfusion study to risk stratify  her.  Once this is complete, we will have the patient back to review the  results of the findings.                              Arturo Morton. Riley Kill, MD, [facility]    TDS/MedQ  DD:  12/30/2005  DT:  12/31/2005  Job #:  [id number]

## 2010-09-16 NOTE — Assessment & Plan Note (Signed)
[facility]                              CARDIOLOGY OFFICE NOTE   NAME:CALHOUNAvri, Paiva                    MRN:          [id number]  DATE:12/04/2005                            DOB:          [birth year]    HISTORY OF PRESENT ILLNESS:  Mrs. Couse returns today for followup. She  was admitted back in May of this year. She was thought to have hypoglycemia.  She was noted to have a herniated nucleus pulposis at C5-6 and C6-7, causing  central stenosis and therefore, the surgery has been recommended. She saw  Dr. Pearlean Brownie at that time. She has had a prior transient ischemic attack and  stroke. The patient did have a cardiac catheterization done 9 years ago by  Dr. Aleen Campi, that did not suggest significant coronary disease but she has  had continued use of insulin during that period. In addition, the patient  has had evidence of duplex imaging. This has demonstrated right 40% to 60%  ICA stenosis with antegrade flow in the vertebral's and no left stenosis.  EKG done in May of this year revealed left bundle branch block as well.   PHYSICAL EXAMINATION:  VITAL SIGNS:  Blood pressure 150/73, pulse 73.  LUNGS:  Fields are clear.   ASSESSMENT/PLAN:  After reviewing all of the data, it is my sense that the  patient should have an Adenosine Cardiolite. Importantly, she has left  bundle branch block on her EKG. In addition, the patient has had exertional  chest discomfort for the past couple of years and has fairly limited  exercise tolerance. Based on these findings, we will go ahead and get this  done, then make a decision about clearance for surgery.                              Arturo Morton. Riley Kill, MD, [facility]    TDS/MedQ  DD:  12/05/2005  DT:  12/05/2005  Job #:  [id number]

## 2010-09-16 NOTE — Assessment & Plan Note (Signed)
[facility]                              CARDIOLOGY OFFICE NOTE   NAME:CALHOUNViann, Nielson                    MRN:          [id number]  DATE:12/26/2005                            DOB:          [birth year]    I met with Ms Novick today, and I met with her and her two daughters.  The  patient has an underlying left bundle branch block.  She has significant  cervical spine issues, and these appear to require surgical intervention  that was identified during her last recent hospitalization by Dr. Pearlean Brownie, and  also Dr. Phoebe Perch.  It has been his assessment that there was potential for  deterioration from a cord compression.  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  There is a suggestion of a 50% left common carotid stenosis.  There  is also a suggestion of left vertebral disease.  The patient has undergone  myocardial perfusion imaging, and this has revealed an ejection fraction of  60%.  There was some apical hypokinesis.  The quantitative perfusion SPECT  images did not suggest evidence of significant ischemia, and ejection  fraction was thought to be 67%.  There was an apical defect that was noted,  it was felt to be a low risk study.   The patient is short of breath at times, and does have fatigue.  She has  insulin-dependent diabetes mellitus, and is on multiple medications which  include fairly high dose insulin, metformin, and for her hypertension she is  on amlodipine, atenolol and furosemide.   At the present time, the issue now evolves around surgical risks and  benefit.  I will discuss her case with Dr. Phoebe Perch.  It would appear that the  surgery is likely necessary to prevent fairly proximal cord compression.  Fortunately, the adenosine study is not a high risk study, and as noted she  did have cardiac catheterization in 1998 that, despite its time frame, did  not suggest  significant disease.  She is at least at intermediate risk, but  we have all discussed this in great detail.  I would not recommend cardiac  catheterization at the present time as a preoperative maneuver, based on her  myocardial perfusion study.  I would continue beta blockers throughout her  surgery.                              Arturo Morton. Riley Kill, MD, [facility]    TDS/MedQ  DD:  12/30/2005  DT:  12/31/2005  Job #:  [id number]   cc:   Clydene Fake, MD

## 2010-09-16 NOTE — H&P (Signed)
[name] [name], [name] [name] NO.:  [id number]   MEDICAL RECORD NO.:  [id number]          PATIENT TYPE:  EMS   LOCATION:  MINO                         FACILITY:  [facility]   PHYSICIAN:  Elliot Cousin, M.D.    DATE OF BIRTH:  1926-10-18   DATE OF ADMISSION:  09/05/2005  DATE OF DISCHARGE:                                HISTORY & PHYSICAL   ADDENDUM:  Please see the previous dictated history and physical on Sep 05, 2005.  This is an addendum.   The following medications were not dictated on the previous history and  physical; they are listed below:  1.  Humulin N 90 units in the morning.  2.  Metformin XR 500 mg two tablets each morning.  3.  Atenolol 50 mg each morning.  4.  Aggrenox 200/25 mg b.i.d.  5.  Furosemide 40 mg daily.  6.  Lotrel 5/10 mg daily.  7.  Sertraline 50 mg daily.  8.  Centrum silver once daily.  9.  Vitamin E once daily.  10. Glucosamine one tablet daily.  11. Isosorbide 60 mg in the afternoon.  12. Cymbalta 60 mg each evening.  13. Amitriptyline 100 mg each evening.  14. Humulin 50/50 -- 60 units in the evening.  15. Diazepam 5 mg q.i.d. p.r.n.  16. Tramadol 50 mg q.i.d. p.r.n.      Elliot Cousin, M.D.  Electronically Signed     DF/MEDQ  D:  09/05/2005  T:  09/05/2005  Job:  [id number]   cc:   Lenon Curt. Chilton Si, M.D.  Fax: [phone]

## 2010-09-16 NOTE — Assessment & Plan Note (Signed)
[facility]                              CARDIOLOGY OFFICE NOTE   NAME:[name] [name], [name]                    MRN:          [id number]  DATE:11/28/2005                            DOB:          1926/09/09    CHIEF COMPLAINT:  I'm here for surgical clearance.   HISTORY OF PRESENT ILLNESS:  The patient is a 77 year old female who is  referred for evaluation.  She apparently is set up for an anterior cervical  fusion.  She saw the anesthesiologist yesterday and is referred for cardiac  clearance.  We have no information on the patient.  She apparently walks and  gets some chest discomfort.  She cannot go very, very far before she starts  to get chest discomfort.  She has a history of TIAs, hypertension, and  diabetes, as well as depression from the past.  She had a prior myocardial  infarction, and she saw Dr. Ferne [name].  She has also seen Dr. Charolette [name]  in the past.  Patient has had a hysterectomy and appendectomy.  Had a kidney  stone in 1985.   Her allergies include CODEINE, PENICILLIN, and IBUPROFEN.   MEDICATIONS:  1.  Humulin N as directed.  2.  Aggrenox 200/25 b.i.d., for which she has had for her TIAs.  3.  Metformin XR 500 mg 2 daily.  4.  Amlodipine.  5.  Benazepril 5/10 mg.  6.  Atenolol 50 mg daily.  7.  Furosemide 40 mg daily.  8.  Sertraline 50 mg daily.  9.  Vitamin E daily.  10. Centrum Silver daily.  11. Humulin 50/50 q.p.m.  12. Isosorbide 60 mg.  13. Amitriptyline 100 mg.  14. Cymbalta 60 mg daily.   FAMILY HISTORY:  Father who died at 34 of an acute event cardiacwise.  Mother also died at 73 with cardiac event.  She has three brothers who have  died, one who had heart trouble.  She has a sister who has had a stent and  one sister without any cardiac-specific problems.  There is a family history  of hypertension, diabetes, and cancer.   REVIEW OF SYSTEMS:  Her review of systems is remarkable for occasional  headaches.  She does have some shortness of breath.  She has a hiatal  hernia.  Has had prior kidney stones.  She also has some leg weakness.   She is retired.  She has three children.  She likes to read.   PHYSICAL EXAMINATION:  VITAL SIGNS:  Blood pressure 140/65. Weight is 163.  Height 5 feet 2 inches.  Pulse 71.  GENERAL:  She is a 77 year old female in no acute distress.  LUNGS:  Lung fields are clear.  CARDIAC:  Regular.  There is not a significant murmur.  EXTREMITIES:  No significant edema.   Electrocardiogram demonstrates normal sinus rhythm with left bundle branch  block.   At the present time, I have no cardiovascular information on this lady.  They have apparently spoken with Dr. Chilton [name] who felt that she was a surgical  candidate.  I have put in a call to  the anesthesiology office to try and  gain more information.  At the present time, we have virtually none, and it  will be difficult.  With a history of exertional angina, we will need to  find out more.                              [name] [name]. [name] Kill, MD, [facility]    TDS/MedQ  DD:  12/05/2005  DT:  12/05/2005  Job #:  [id number]   cc:   [name] Maple, MD

## 2010-09-16 NOTE — Discharge Summary (Signed)
[facility]  Patient:    [name] [name], [name] [name]                    MRN: [id number] Adm. Date:  [id number] Disc. Date: [id number] Attending:  Terald Sleeper                           Discharge Summary  DISCHARGE DIAGNOSES:  1. Vertigo, unclear etiology, resolved.  2. Diabetes mellitus.  3. Chest pain.  4. Hypertension.  5. Polymyalgia rheumatica.  6. Left bundle branch block.  CONSULTATIONS:  Gustavus Messing. Orlin Hilding, M.D.  PROCEDURES:  None.  HISTORY OF PRESENT ILLNESS:  This is a 77 year old white female who was under the primary care of Dr. Murray Hodgkins who presented on Sep 13, 1999, with complaints of weakness and vertigo.  She was somewhat of a difficult historian and was rather vague.  She was complaining of multiple falls without injury and feeling weak.  She could not sit up.  She was noted to have a low blood pressure in the emergency room and a white count of 24,000.  Some of her symptoms dated back one year.  She was noted to have an MRI of the brain in March, 1999, that showed diffuse small vessel disease and some mild sinusitis. Her exam showed some diffuse tenderness in her lower abdomen; otherwise, she had a normal neurologic exam with some mild weakness bilaterally in her legs which may have been due to poor effort.  LABORATORY DATA:  Lab work on admission was again significant for the white blood cell count of 24,000 as well as a urine that showed a few epithelial cells and a small amount of leukocyte esterase, stool cultures that were negative.  Electrolytes normal.  Cardiac enzymes normal.  Thyroid, TSH normal. PT, PTT normal.  HOSPITAL COURSE:  Patient was admitted and placed in a regular bed.  Initial workup for this included an ultrasound of her carotid artery done on Sep 14, 1999, which showed no evidence of significant stenosis.  She also had an MRI of her brain done on Sep 14, 1999, which showed cerebral and  cerebellar atrophy as well as advanced small vessel disease but no acute abnormality. There was, of note, some slight progression of her small vessel disease when compared to March, 1999.  She also underwent MR angiography which showed no significant intracranial disease.  Dr. Orlin Hilding was consulted to see the patient.  She felt that this patient with tinnitus and disequilibrium did not have a clear neurologic explanation.  She did not have any evidence of vascular disease in her large vessels but felt the patient would benefit from aspirin and/or Plavix because of her risk for stroke and her evidence of small vessel disease.  She may have had some orthostatic hypotension secondary to fluid loss from her chronic diarrhea which she presented with.  The patient was doing well and, on Sep 16, 1999, was going to be discharged.  However, she developed an episode of chest pain.  EKG was done which was normal showing old left bundle branch block.  Cardiac enzymes were done which were negative.  The patient continued to do well without any further episodes of chest pain throughout her hospitalization.  She was noted to have some low blood sugars and she is on quite a bit of insulin.  She is going to get diabetic teaching on discharge.  She was discharged on Sep 17, 1999, in stable condition.  DISCHARGE MEDICATIONS:  1. Lasix 40 mg a day.  2. Premarin 0.625 mg a day.  3. Prednisone 5 mg a day.  4. Imdur 60 mg a day.  5. Zoloft 50 mg a day.  6. Atenolol 25 mg b.i.d.  7. Vioxx 25 mg a day.  8. Neurontin 100 mg t.i.d.  9. Lotensin 10 mg q.d. 10. Norvasc 10 mg q.d. 11. Insulin 70/30 decreased to 78 units in the morning and 70 units in the     evening. 12. Elavil 10 mg at night. 13. Nitroglycerin p.r.n. 14. Aspirin q.d.  Of note, medications that have been changed include her Imdur increased to 60, her insulin decreased as stated, and aspirin is a new medication for her.  FOLLOW-UP:  Dr.  Murray Hodgkins in three weeks.  The patient will call the office to arrange this. DD:  09/17/99 TD:  09/20/99 Job: [id number]

## 2010-09-23 ENCOUNTER — Telehealth: Payer: Self-pay | Admitting: Cardiology

## 2010-09-23 ENCOUNTER — Encounter: Payer: Self-pay | Admitting: Cardiology

## 2010-09-23 NOTE — Telephone Encounter (Signed)
Faxed OV & EKG to [facility] ([phone]).

## 2010-09-27 ENCOUNTER — Encounter: Payer: Self-pay | Admitting: Cardiology

## 2010-09-29 ENCOUNTER — Encounter: Payer: Self-pay | Admitting: Cardiology

## 2010-10-02 ENCOUNTER — Encounter: Payer: Self-pay | Admitting: Cardiology

## 2010-10-03 ENCOUNTER — Encounter: Payer: Self-pay | Admitting: Cardiology

## 2010-10-04 ENCOUNTER — Encounter: Payer: Self-pay | Admitting: Cardiology

## 2010-10-05 ENCOUNTER — Encounter: Payer: Self-pay | Admitting: Cardiology

## 2010-10-06 ENCOUNTER — Encounter: Payer: Self-pay | Admitting: Cardiology

## 2010-10-10 ENCOUNTER — Encounter: Payer: Self-pay | Admitting: Cardiology

## 2010-10-12 ENCOUNTER — Encounter: Payer: Self-pay | Admitting: Cardiology

## 2010-10-14 ENCOUNTER — Encounter: Payer: Self-pay | Admitting: Internal Medicine

## 2010-10-20 ENCOUNTER — Encounter: Payer: Self-pay | Admitting: Cardiology

## 2010-11-17 ENCOUNTER — Encounter: Payer: Self-pay | Admitting: Cardiology

## 2010-11-17 ENCOUNTER — Ambulatory Visit (INDEPENDENT_AMBULATORY_CARE_PROVIDER_SITE_OTHER): Payer: Medicare Other | Admitting: Cardiology

## 2010-11-17 DIAGNOSIS — R0600 Dyspnea, unspecified: Secondary | ICD-10-CM

## 2010-11-17 DIAGNOSIS — R0602 Shortness of breath: Secondary | ICD-10-CM

## 2010-11-17 DIAGNOSIS — I2510 Atherosclerotic heart disease of native coronary artery without angina pectoris: Secondary | ICD-10-CM

## 2010-11-17 DIAGNOSIS — I10 Essential (primary) hypertension: Secondary | ICD-10-CM

## 2010-11-17 DIAGNOSIS — E785 Hyperlipidemia, unspecified: Secondary | ICD-10-CM

## 2010-11-17 LAB — BRAIN NATRIURETIC PEPTIDE: Pro B Natriuretic peptide (BNP): 28 pg/mL (ref 0.0–100.0)

## 2010-11-17 MED ORDER — PRAVASTATIN SODIUM 40 MG PO TABS: 40.0000 mg | ORAL_TABLET | Freq: Every evening | ORAL | Status: DC

## 2010-11-17 NOTE — Assessment & Plan Note (Signed)
Add statin and check lipids and liver in 6 weeks.

## 2010-11-17 NOTE — Progress Notes (Signed)
HPI:77 year old female previously followed by Dr. Riley Kill for followup of coronary disease. Echocardiogram in 2009 showed normal function. Cardiac catheterization in 2009 revealed vigorous global systolic function without segmental wall motion abnormality. The left main is free of critical disease. The LAD courses to the apex and provides a major diagonal.  The LAD has minimal luminal irregularity with no significant focal stenoses.There is a ramus intermedius that is without critical narrowing. The circumflex consists of a marginal branch that bifurcates and is without significant disease. The right coronary artery demonstrates a fairly focal, somewhat discrete and eccentric stenosis of 70-75% near the junction of the proximal and midvessel.  The distal vessel provides a posterior descending and posterolateral system, all of which appear free of critical disease. The patient had PCI of the right coronary artery at that time. Carotid Dopplers in June 2012 showed no significant stenosis. Echocardiogram in June 2012 showed normal LV function, moderate left ventricular hypertrophy, mild left atrial enlargement. There was mild to moderate tricuspid regurgitation. Patient last saw Dr Riley Kill in 2010. Since then, she states she has had 2 episodes of chest pain. These episodes are somewhat vague and she finds them difficult to describe. Note she does have some dementia. She also describes occasional dyspnea. She has very limited mobility but states this occurs with exertion. There is no PND or pedal edema.  Current Outpatient Prescriptions  Medication Sig Dispense Refill  . amLODipine-benazepril (LOTREL) 5-10 MG per capsule Take 1 capsule by mouth daily.        Marland Kitchen atenolol (TENORMIN) 25 MG tablet Take 25 mg by mouth 3 (three) times daily.       . cetirizine (ZYRTEC) 10 MG chewable tablet Chew 10 mg by mouth daily.        . diazepam (VALIUM) 5 MG tablet Take 5 mg by mouth every 6 (six) hours as needed.        .  ergocalciferol (VITAMIN D2) 50000 UNITS capsule Take 50,000 Units by mouth every 30 (thirty) days.        . ferrous sulfate 325 (65 FE) MG tablet Take 325 mg by mouth daily with breakfast.        . gabapentin (NEURONTIN) 100 MG capsule Take 100 mg by mouth 2 (two) times daily.        Marland Kitchen HYDROcodone-acetaminophen (VICODIN) 5-500 MG per tablet Take 1 tablet by mouth as needed.        . insulin aspart (NOVOLOG) 100 UNIT/ML injection Inject into the skin as directed.        . insulin glargine (LANTUS) 100 UNIT/ML injection Inject into the skin as directed.        . Olopatadine HCl (PATADAY) 0.2 % SOLN Apply to eye. 1 drop each eye daily       . omeprazole (PRILOSEC) 20 MG capsule Take 20 mg by mouth daily.        Marland Kitchen oxyCODONE-acetaminophen (PERCOCET) 5-325 MG per tablet Take 1 tablet by mouth. Up to 4 times per day as needed for pain       . QUEtiapine (SEROQUEL) 100 MG tablet Take 100 mg by mouth 2 (two) times daily.        Marland Kitchen senna (SENOKOT) 8.6 MG TABS Take 1 tablet by mouth at bedtime.        . sertraline (ZOLOFT) 100 MG tablet Take by mouth. Take 125mg  daily      . sitaGLIPtin (JANUVIA) 100 MG tablet Take 100 mg by mouth daily.  Past Medical History  Diagnosis Date  . Coronary atherosclerosis of unspecified type of vessel, native or graft   . Depressive disorder, not elsewhere classified   . Type II or unspecified type diabetes mellitus with neurological manifestations, not stated as uncontrolled   . Other and unspecified hyperlipidemia   . Unspecified essential hypertension   . Occlusion and stenosis of carotid artery without mention of cerebral infarction   . Personal history of other diseases of circulatory system   . Unspecified cerebral artery occlusion with cerebral infarction   . Polymyalgia rheumatica   . Obesity, unspecified   . Leukocytosis, unspecified     Past Surgical History  Procedure Date  . Total abdominal hysterectomy   . Appendectomy   . Cataract surgery       bilateral w intraocular placement  . Refractive surgery     both eyes x6  . Posterior laminectomy / decompression cervical spine     w diskectomy and fusion of c5-c7 and anterior cervical plate  . Surgery of kidney stones     a few years ago    History   Social History  . Marital Status: Married    Spouse Name: N/A    Number of Children: N/A  . Years of Education: N/A   Occupational History  . Not on file.   Social History Main Topics  . Smoking status: Never Smoker   . Smokeless tobacco: Not on file  . Alcohol Use: No  . Drug Use: No  . Sexually Active: Not on file   Other Topics Concern  . Not on file   Social History Narrative   Married, lives at home w her husband and son, ambulant w help of a walker but slowly getting almost bedridden.    ROS: no fevers or chills, productive cough, hemoptysis, dysphasia, odynophagia, melena, hematochezia, dysuria, hematuria, rash, seizure activity, orthopnea, PND, pedal edema, claudication. Remaining systems are negative.  Physical Exam: Well-developed frail in no acute distress.  Skin is warm and dry.  HEENT is significant for poor dentition Neck is supple. No thyromegaly.  Chest is clear to auscultation with normal expansion.  Cardiovascular exam is regular rate and rhythm.  Abdominal exam nontender or distended. No masses palpated. Extremities show no edema. neuro grossly intact  ECG Sinus rhythm, left bundle branch block.

## 2010-11-17 NOTE — Patient Instructions (Signed)
Your physician recommends that you schedule a follow-up appointment in: WITH DR Riley Kill IN 3 MONTHS  START ASPIRIN 81MG  ONCE DAILY WITH FOOD  START PRAVASTATIN 40 MG ONCE DAILY AT BEDTIME  Your physician recommends that you return for lab work in: 6 WEEKS=FASTING

## 2010-11-17 NOTE — Assessment & Plan Note (Signed)
Blood pressure controlled. Continue present medications. 

## 2010-11-17 NOTE — Assessment & Plan Note (Signed)
Add aspirin and Pravachol 40 mg daily. Patient and daughter want only conservative measures and no aggressive evaluation. I think this is appropriate. She does reside in a nursing home and has dementia.

## 2010-11-17 NOTE — Assessment & Plan Note (Signed)
Etiology unclear. Not volume overloaded on examination. Recent echocardiogram showed normal LV function although there was diastolic dysfunction. Check BNP. If elevated may need low-dose diuretic.

## 2010-11-21 ENCOUNTER — Telehealth: Payer: Self-pay | Admitting: Cardiology

## 2010-11-21 ENCOUNTER — Encounter: Payer: Self-pay | Admitting: *Deleted

## 2010-11-21 NOTE — Telephone Encounter (Signed)
Faxed LOV & EKG to [facility] @ [facility] ([phone]).

## 2010-11-22 ENCOUNTER — Encounter: Payer: Self-pay | Admitting: Cardiology

## 2010-12-15 ENCOUNTER — Encounter: Payer: Self-pay | Admitting: Cardiology

## 2010-12-30 ENCOUNTER — Encounter: Payer: Self-pay | Admitting: Cardiology

## 2011-01-19 ENCOUNTER — Encounter: Payer: Self-pay | Admitting: Cardiology

## 2011-01-23 LAB — LIPID PANEL
HDL: 22 — ABNORMAL LOW
Total CHOL/HDL Ratio: 4.7
Triglycerides: 280 — ABNORMAL HIGH
VLDL: 56 — ABNORMAL HIGH

## 2011-01-23 LAB — URINALYSIS, ROUTINE W REFLEX MICROSCOPIC
Bilirubin Urine: NEGATIVE
Glucose, UA: 500 — AB
Hgb urine dipstick: NEGATIVE
Ketones, ur: NEGATIVE
Nitrite: NEGATIVE
Protein, ur: NEGATIVE
Specific Gravity, Urine: 1.027
Urobilinogen, UA: 0.2
pH: 5.5

## 2011-01-23 LAB — CBC
HCT: 38.9
HCT: 35.7 — ABNORMAL LOW
HCT: 36.8
HCT: 39.7
Hemoglobin: 13
Hemoglobin: 11.7 — ABNORMAL LOW
Hemoglobin: 12.2
Hemoglobin: 12.9
MCHC: 33.3
MCHC: 32.5
MCHC: 32.8
MCHC: 33.2
MCV: 87.2
MCV: 88.2
MCV: 88.4
MCV: 88.4
Platelets: 191
Platelets: 186
Platelets: 199
Platelets: 204
RBC: 4.46
RBC: 4.56
RBC: 4.04
RBC: 4.18
RBC: 4.49
RDW: 14.9
RDW: 15.4
RDW: 15.6 — ABNORMAL HIGH
RDW: 15.6 — ABNORMAL HIGH
WBC: 13.1 — ABNORMAL HIGH
WBC: 14 — ABNORMAL HIGH
WBC: 14.9 — ABNORMAL HIGH
WBC: 15.5 — ABNORMAL HIGH
WBC: 16.8 — ABNORMAL HIGH

## 2011-01-23 LAB — BASIC METABOLIC PANEL
BUN: 19
CO2: 26
CO2: 28
Calcium: 9.1
Chloride: 105
Chloride: 106
GFR calc Af Amer: >60
Glucose, Bld: 209 — ABNORMAL HIGH
Potassium: 4.3
Sodium: 139
Sodium: 140

## 2011-01-23 LAB — COMPREHENSIVE METABOLIC PANEL WITH GFR
ALT: 24
AST: 39 — ABNORMAL HIGH
Albumin: 3.8
Alkaline Phosphatase: 85
BUN: 19
CO2: 30
Calcium: 9.4
Chloride: 102
Creatinine, Ser: 0.94
GFR calc non Af Amer: 57 — ABNORMAL LOW
Glucose, Bld: 92
Potassium: 4.3
Sodium: 140
Total Bilirubin: 0.5
Total Protein: 6.8

## 2011-01-23 LAB — URINE MICROSCOPIC-ADD ON

## 2011-01-23 LAB — BASIC METABOLIC PANEL WITH GFR
BUN: 15
BUN: 17
CO2: 29
CO2: 26
Calcium: 9.4
Calcium: 9.2
Chloride: 105
Chloride: 105
Creatinine, Ser: 0.78
Creatinine, Ser: 0.79
GFR calc non Af Amer: >60
GFR calc non Af Amer: >60
Glucose, Bld: 63 — ABNORMAL LOW
Glucose, Bld: 164 — ABNORMAL HIGH
Potassium: 4.1
Potassium: 4.3
Sodium: 140
Sodium: 137

## 2011-01-23 LAB — PROTIME-INR
INR: 1
Prothrombin Time: 13.1

## 2011-01-23 LAB — DIFFERENTIAL
Basophils Relative: 3 — ABNORMAL HIGH
Eosinophils Absolute: 0.2
Eosinophils Relative: 1
Monocytes Relative: 8
Neutrophils Relative %: 54

## 2011-01-23 LAB — URINE CULTURE

## 2011-01-23 LAB — D-DIMER, QUANTITATIVE

## 2011-01-24 LAB — BASIC METABOLIC PANEL
BUN: 29 — ABNORMAL HIGH
CO2: 26
CO2: 30
CO2: 22
Calcium: 9.5
Calcium: 9.5
Chloride: 103
Chloride: 98
Creatinine, Ser: 0.78
Creatinine, Ser: 0.81
Creatinine, Ser: 0.9
GFR calc Af Amer: >60
GFR calc Af Amer: >60
GFR calc Af Amer: >60
GFR calc Af Amer: >60
GFR calc non Af Amer: >60
Glucose, Bld: 194 — ABNORMAL HIGH
Potassium: 4.1
Potassium: 4.5
Sodium: 141

## 2011-01-24 LAB — CBC
HCT: 35.2 — ABNORMAL LOW
HCT: 35.9 — ABNORMAL LOW
HCT: 36.1
HCT: 37.7
Hemoglobin: 11.8 — ABNORMAL LOW
Hemoglobin: 12.1
Hemoglobin: 12.2
MCHC: 32.5
MCHC: 32.7
MCHC: 32.9
MCV: 88.2
MCV: 88.6
Platelets: 160
Platelets: 177
RBC: 4.05
RBC: 4.09
RBC: 4.15
RDW: 15.3
WBC: 13.6 — ABNORMAL HIGH
WBC: 16.3 — ABNORMAL HIGH

## 2011-01-24 LAB — URINALYSIS, ROUTINE W REFLEX MICROSCOPIC
Glucose, UA: NEGATIVE
Hgb urine dipstick: NEGATIVE
Ketones, ur: NEGATIVE
Protein, ur: NEGATIVE
pH: 7

## 2011-01-24 LAB — BODY FLUID CULTURE: Culture: NO GROWTH

## 2011-01-24 LAB — DIFFERENTIAL
Basophils Relative: 1
Lymphocytes Relative: 15
Lymphs Abs: 2.5
Monocytes Absolute: 1.4 — ABNORMAL HIGH
Monocytes Absolute: 1.9 — ABNORMAL HIGH
Monocytes Relative: 10
Monocytes Relative: 12
Neutro Abs: 11.7 — ABNORMAL HIGH
Neutro Abs: 9.8 — ABNORMAL HIGH
Neutrophils Relative %: 72

## 2011-01-24 LAB — SYNOVIAL CELL COUNT + DIFF, W/ CRYSTALS
Crystals, Fluid: NONE SEEN
Lymphocytes-Synovial Fld: 51 — ABNORMAL HIGH
Monocyte-Macrophage-Synovial Fluid: 27 — ABNORMAL LOW
WBC, Synovial: 450 — ABNORMAL HIGH

## 2011-01-24 LAB — ANA: Anti Nuclear Antibody(ANA): NEGATIVE

## 2011-01-24 LAB — HEMOGLOBIN A1C: Mean Plasma Glucose: 200

## 2011-01-24 LAB — CARDIAC PANEL(CRET KIN+CKTOT+MB+TROPI)
CK, MB: 2.4
Relative Index: 3.1 — ABNORMAL HIGH
Total CK: 316 — ABNORMAL HIGH
Total CK: 94
Troponin I: 0.01

## 2011-01-24 LAB — RHEUMATOID FACTOR: Rhuematoid fact SerPl-aCnc: <20

## 2011-01-24 LAB — GRAM STAIN

## 2011-01-24 LAB — SEDIMENTATION RATE: Sed Rate: 75 — ABNORMAL HIGH

## 2011-01-24 LAB — CK TOTAL AND CKMB (NOT AT ARMC): Relative Index: INVALID

## 2011-02-08 ENCOUNTER — Ambulatory Visit: Payer: Medicare Other | Admitting: Cardiology

## 2011-02-09 ENCOUNTER — Encounter: Payer: Self-pay | Admitting: Cardiology

## 2011-02-13 LAB — SEDIMENTATION RATE
Sed Rate: 29 — ABNORMAL HIGH
Sed Rate: 31 — ABNORMAL HIGH

## 2011-02-13 LAB — CBC
Hemoglobin: 11.6 — ABNORMAL LOW
Hemoglobin: 12.3
MCHC: 32.2
MCHC: 32.2
MCV: 86.6
MCV: 86.7
Platelets: 208
Platelets: 256
RBC: 4.35
RDW: 15.3 — ABNORMAL HIGH
RDW: 15.5 — ABNORMAL HIGH
RDW: 15.7 — ABNORMAL HIGH
RDW: 16.2 — ABNORMAL HIGH

## 2011-02-13 LAB — BASIC METABOLIC PANEL
Calcium: 9
Calcium: 9.1
Creatinine, Ser: 0.78
Creatinine, Ser: 0.84
GFR calc Af Amer: >60
GFR calc Af Amer: >60
GFR calc Af Amer: >60
GFR calc non Af Amer: >60
GFR calc non Af Amer: >60
Glucose, Bld: 140 — ABNORMAL HIGH
Potassium: 3.8
Sodium: 140
Sodium: 142
Sodium: 142

## 2011-02-13 LAB — DIFFERENTIAL
Basophils Absolute: 0
Basophils Relative: 0
Eosinophils Absolute: 0.1
Lymphocytes Relative: 37
Lymphs Abs: 7.4 — ABNORMAL HIGH
Monocytes Absolute: 1.1 — ABNORMAL HIGH
Monocytes Relative: 7
Neutro Abs: 7
Neutrophils Relative %: 51
Neutrophils Relative %: 54

## 2011-02-13 LAB — CLOSTRIDIUM DIFFICILE EIA: C difficile Toxins A+B, EIA: NEGATIVE

## 2011-02-13 LAB — COMPREHENSIVE METABOLIC PANEL
ALT: 40 — ABNORMAL HIGH
AST: 49 — ABNORMAL HIGH
Calcium: 9.7
Creatinine, Ser: 0.91
GFR calc Af Amer: >60
Sodium: 140
Total Protein: 6.7

## 2011-02-13 LAB — URINE CULTURE
Colony Count: NO GROWTH
Culture: NO GROWTH

## 2011-02-13 LAB — URINALYSIS, ROUTINE W REFLEX MICROSCOPIC
Glucose, UA: NEGATIVE
Hgb urine dipstick: NEGATIVE
Specific Gravity, Urine: 1.012

## 2011-02-13 LAB — STOOL CULTURE

## 2012-01-25 ENCOUNTER — Encounter: Payer: Self-pay | Admitting: Internal Medicine

## 2012-06-14 ENCOUNTER — Emergency Department (HOSPITAL_BASED_OUTPATIENT_CLINIC_OR_DEPARTMENT_OTHER)
Admission: EM | Admit: 2012-06-14 | Discharge: 2012-06-14 | Disposition: A | Discharge status: Home / Self Care | Payer: PRIVATE HEALTH INSURANCE | Attending: Emergency Medicine | Admitting: Emergency Medicine

## 2012-06-14 ENCOUNTER — Encounter (HOSPITAL_BASED_OUTPATIENT_CLINIC_OR_DEPARTMENT_OTHER): Payer: Self-pay

## 2012-06-14 ENCOUNTER — Emergency Department (HOSPITAL_BASED_OUTPATIENT_CLINIC_OR_DEPARTMENT_OTHER): Payer: PRIVATE HEALTH INSURANCE

## 2012-06-14 DIAGNOSIS — Z8679 Personal history of other diseases of the circulatory system: Secondary | ICD-10-CM | POA: Insufficient documentation

## 2012-06-14 DIAGNOSIS — F329 Major depressive disorder, single episode, unspecified: Secondary | ICD-10-CM | POA: Insufficient documentation

## 2012-06-14 DIAGNOSIS — E785 Hyperlipidemia, unspecified: Secondary | ICD-10-CM | POA: Insufficient documentation

## 2012-06-14 DIAGNOSIS — R296 Repeated falls: Secondary | ICD-10-CM | POA: Insufficient documentation

## 2012-06-14 DIAGNOSIS — Y9301 Activity, walking, marching and hiking: Secondary | ICD-10-CM | POA: Insufficient documentation

## 2012-06-14 DIAGNOSIS — Y929 Unspecified place or not applicable: Secondary | ICD-10-CM | POA: Insufficient documentation

## 2012-06-14 DIAGNOSIS — Z7982 Long term (current) use of aspirin: Secondary | ICD-10-CM | POA: Insufficient documentation

## 2012-06-14 DIAGNOSIS — E1149 Type 2 diabetes mellitus with other diabetic neurological complication: Secondary | ICD-10-CM | POA: Insufficient documentation

## 2012-06-14 DIAGNOSIS — I10 Essential (primary) hypertension: Secondary | ICD-10-CM | POA: Insufficient documentation

## 2012-06-14 DIAGNOSIS — S161XXA Strain of muscle, fascia and tendon at neck level, initial encounter: Secondary | ICD-10-CM

## 2012-06-14 DIAGNOSIS — S139XXA Sprain of joints and ligaments of unspecified parts of neck, initial encounter: Secondary | ICD-10-CM | POA: Insufficient documentation

## 2012-06-14 DIAGNOSIS — S0101XA Laceration without foreign body of scalp, initial encounter: Secondary | ICD-10-CM

## 2012-06-14 DIAGNOSIS — Z794 Long term (current) use of insulin: Secondary | ICD-10-CM | POA: Insufficient documentation

## 2012-06-14 DIAGNOSIS — E669 Obesity, unspecified: Secondary | ICD-10-CM | POA: Insufficient documentation

## 2012-06-14 DIAGNOSIS — F3289 Other specified depressive episodes: Secondary | ICD-10-CM | POA: Insufficient documentation

## 2012-06-14 DIAGNOSIS — W19XXXA Unspecified fall, initial encounter: Secondary | ICD-10-CM

## 2012-06-14 DIAGNOSIS — D72829 Elevated white blood cell count, unspecified: Secondary | ICD-10-CM | POA: Insufficient documentation

## 2012-06-14 DIAGNOSIS — S0990XA Unspecified injury of head, initial encounter: Secondary | ICD-10-CM | POA: Insufficient documentation

## 2012-06-14 DIAGNOSIS — S0100XA Unspecified open wound of scalp, initial encounter: Secondary | ICD-10-CM | POA: Insufficient documentation

## 2012-06-14 DIAGNOSIS — Z8739 Personal history of other diseases of the musculoskeletal system and connective tissue: Secondary | ICD-10-CM | POA: Insufficient documentation

## 2012-06-14 DIAGNOSIS — I2510 Atherosclerotic heart disease of native coronary artery without angina pectoris: Secondary | ICD-10-CM | POA: Insufficient documentation

## 2012-06-14 NOTE — ED Provider Notes (Signed)
History     CSN: [id number]  Arrival date & time      None     No chief complaint on file.   (Consider location/radiation/quality/duration/timing/severity/associated sxs/prior treatment) HPI Comments: Patient a resident of [facility].  Was attempting to help her husband this morning and fell, striking her head and causing a laceration.  There was a reported brief loc.  She complains of headache and neck pain.  No other complaints.  She is not on any anticoagulants.    Patient is a 79 y.o. female presenting with fall. The history is provided by the patient.  Fall The accident occurred less than 1 hour ago. The fall occurred while walking. She fell from a height of 1 to 2 ft. She landed on a hard floor. The volume of blood lost was minimal. The point of impact was the head. The pain is present in the head and neck. The pain is moderate. She was not ambulatory at the scene. Associated symptoms include headaches. Pertinent negatives include no abdominal pain and no vomiting. The symptoms are aggravated by activity. Treatment on scene includes a c-collar and a backboard. She has tried nothing for the symptoms.    Past Medical History  Diagnosis Date  . Coronary atherosclerosis of unspecified type of vessel, native or graft   . Depressive disorder, not elsewhere classified   . Type II or unspecified type diabetes mellitus with neurological manifestations, not stated as uncontrolled   . Other and unspecified hyperlipidemia   . Unspecified essential hypertension   . Occlusion and stenosis of carotid artery without mention of cerebral infarction   . Personal history of other diseases of circulatory system   . Unspecified cerebral artery occlusion with cerebral infarction   . Polymyalgia rheumatica   . Obesity, unspecified   . Leukocytosis, unspecified     Past Surgical History  Procedure Laterality Date  . Total abdominal hysterectomy    . Appendectomy    . Cataract surgery       bilateral w intraocular placement  . Refractive surgery      both eyes x6  . Posterior laminectomy / decompression cervical spine      w diskectomy and fusion of c5-c7 and anterior cervical plate  . Surgery of kidney stones      a few years ago    Family History  Problem Relation Age of Onset  . Coronary artery disease Other     History  Substance Use Topics  . Smoking status: Never Smoker   . Smokeless tobacco: Not on file  . Alcohol Use: No    OB History   Grav Para Term Preterm Abortions TAB SAB Ect Mult Living                  Review of Systems  Gastrointestinal: Negative for vomiting and abdominal pain.  Neurological: Positive for headaches.  All other systems reviewed and are negative.    Allergies  Codeine; Hydrocodone; Ibuprofen; Penicillins; and Sulfonamide derivatives  Home Medications   Current Outpatient Rx  Name  Route  Sig  Dispense  Refill  . amLODipine-benazepril (LOTREL) 5-10 MG per capsule   Oral   Take 1 capsule by mouth daily.           Marland Kitchen aspirin EC 81 MG tablet   Oral   Take 1 tablet (81 mg total) by mouth daily.         Marland Kitchen atenolol (TENORMIN) 25 MG tablet   Oral  Take 25 mg by mouth 3 (three) times daily.          . cetirizine (ZYRTEC) 10 MG chewable tablet   Oral   Chew 10 mg by mouth daily.           . diazepam (VALIUM) 5 MG tablet   Oral   Take 5 mg by mouth every 6 (six) hours as needed.           . ergocalciferol (VITAMIN D2) 50000 UNITS capsule   Oral   Take 50,000 Units by mouth every 30 (thirty) days.           . ferrous sulfate 325 (65 FE) MG tablet   Oral   Take 325 mg by mouth daily with breakfast.           . gabapentin (NEURONTIN) 100 MG capsule   Oral   Take 100 mg by mouth 2 (two) times daily.           Marland Kitchen HYDROcodone-acetaminophen (VICODIN) 5-500 MG per tablet   Oral   Take 1 tablet by mouth as needed.           . insulin aspart (NOVOLOG) 100 UNIT/ML injection   Subcutaneous   Inject  into the skin as directed.           . insulin glargine (LANTUS) 100 UNIT/ML injection   Subcutaneous   Inject into the skin as directed.           . Olopatadine HCl (PATADAY) 0.2 % SOLN   Ophthalmic   Apply to eye. 1 drop each eye daily          . omeprazole (PRILOSEC) 20 MG capsule   Oral   Take 20 mg by mouth daily.           Marland Kitchen oxyCODONE-acetaminophen (PERCOCET) 5-325 MG per tablet   Oral   Take 1 tablet by mouth. Up to 4 times per day as needed for pain          . EXPIRED: pravastatin (PRAVACHOL) 40 MG tablet   Oral   Take 1 tablet (40 mg total) by mouth every evening.   30 tablet   11   . QUEtiapine (SEROQUEL) 100 MG tablet   Oral   Take 100 mg by mouth 2 (two) times daily.           Marland Kitchen senna (SENOKOT) 8.6 MG TABS   Oral   Take 1 tablet by mouth at bedtime.           . sertraline (ZOLOFT) 100 MG tablet   Oral   Take by mouth. Take 125mg  daily         . sitaGLIPtin (JANUVIA) 100 MG tablet   Oral   Take 100 mg by mouth daily.             There were no vitals taken for this visit.  Physical Exam  Nursing note and vitals reviewed. Constitutional: She is oriented to person, place, and time. She appears well-developed and well-nourished.  Elderly female in no distress.  She appears slightly confused.  HENT:  Head: Normocephalic.  There is dressing in place overtop of a laceration to the posterior scalp.  Under the dressing is a small area of macerated tissue to the occiput.  The bleeding is controlled.  Eyes: EOM are normal. Pupils are equal, round, and reactive to light.  Cardiovascular: Normal rate and regular rhythm.   No murmur heard. Pulmonary/Chest: Effort normal and  breath sounds normal. No respiratory distress.  Abdominal: Soft. Bowel sounds are normal.  Musculoskeletal: Normal range of motion. She exhibits no edema.  Neurological: She is alert and oriented to person, place, and time. No cranial nerve deficit. Coordination normal.   Skin: Skin is warm and dry.    ED Course  Procedures (including critical care time)  Labs Reviewed - No data to display No results found.   No diagnosis found.    MDM  The patient presents here after a fall at the ecf.  She has an small area of macerated tissue to the occiput but nothing that is amenable to suturing.  The bleeding has stopped and I feel as though a dressing is all that is required.  She will be returned to the ecf, return prn.        Geoffery Lyons, MD 06/14/12 [phone]

## 2012-06-14 NOTE — ED Notes (Signed)
C-Collar removed by Dr. Judd Lien.

## 2012-06-14 NOTE — ED Notes (Signed)
Patient transported to CT 

## 2012-06-14 NOTE — ED Notes (Signed)
[facility] called for transport.  

## 2012-06-14 NOTE — ED Notes (Signed)
Per [facility] staff pt was assisting spouse and fell striking head on floor.  Laceration to occipital region and pt reports neck pain. Unknown LOC.

## 2012-06-14 NOTE — ED Notes (Signed)
[facility] at bedside for transport.  

## 2012-08-04 DIAGNOSIS — F0151 Vascular dementia with behavioral disturbance: Secondary | ICD-10-CM

## 2012-08-04 DIAGNOSIS — F22 Delusional disorders: Secondary | ICD-10-CM

## 2012-08-19 ENCOUNTER — Other Ambulatory Visit: Payer: Self-pay | Admitting: *Deleted

## 2012-08-19 MED ORDER — DIAZEPAM 5 MG PO TABS: ORAL_TABLET | ORAL | Status: DC

## 2012-09-20 DIAGNOSIS — I798 Other disorders of arteries, arterioles and capillaries in diseases classified elsewhere: Secondary | ICD-10-CM

## 2012-09-20 DIAGNOSIS — G909 Disorder of the autonomic nervous system, unspecified: Secondary | ICD-10-CM

## 2012-09-20 DIAGNOSIS — E1149 Type 2 diabetes mellitus with other diabetic neurological complication: Secondary | ICD-10-CM

## 2012-10-04 DIAGNOSIS — N189 Chronic kidney disease, unspecified: Secondary | ICD-10-CM

## 2012-10-04 DIAGNOSIS — N183 Chronic kidney disease, stage 3 (moderate): Secondary | ICD-10-CM

## 2012-10-04 DIAGNOSIS — I130 Hypertensive heart and chronic kidney disease with heart failure and stage 1 through stage 4 chronic kidney disease, or unspecified chronic kidney disease: Secondary | ICD-10-CM

## 2012-10-04 DIAGNOSIS — I509 Heart failure, unspecified: Secondary | ICD-10-CM

## 2012-11-08 ENCOUNTER — Non-Acute Institutional Stay (SKILLED_NURSING_FACILITY): Payer: PRIVATE HEALTH INSURANCE | Admitting: Internal Medicine

## 2012-11-08 DIAGNOSIS — I130 Hypertensive heart and chronic kidney disease with heart failure and stage 1 through stage 4 chronic kidney disease, or unspecified chronic kidney disease: Secondary | ICD-10-CM

## 2012-11-08 DIAGNOSIS — N183 Chronic kidney disease, stage 3 unspecified: Secondary | ICD-10-CM

## 2012-11-08 DIAGNOSIS — I509 Heart failure, unspecified: Secondary | ICD-10-CM

## 2012-11-08 NOTE — Progress Notes (Signed)
Patient ID: [name] [name], female   DOB: 1926/08/13, 79 y.o.   MRN: [id number] Facility; [name]. Chief complaint; routine [facility] visit. History; the patient has been here since 2010. She is a most problematic history of significant coronary artery disease, status post stent placement in a type II diabetic care. She is known macrovascular disease with peripheral vascular disease and carotid artery disease. She has episodic falls. She is a DO NOT RESUSCITATE and do not hospitalize.  Past medical history/problem. #1 multi-infarct dementia. #2 history of coronary artery disease, status post stent. #3 diabetic retinopathy.  #4 CHF. #5 hypertension. #6 history of psychosis. #7 history of a CVA. #8 gastroesophageal reflux disease. #9 anemia likely chronic disease  Medication list reviewed  Physical exam temperature is 90.8, pulse 62, O2 sat 94% on room air. Respiratory clear entry bilaterally. Cardiac heart sounds are normal. No gallops. Mild lower extremity edema. Abdomen somewhat distended, however, no liver no spleen no masses.  Impression/plan #1 coronary artery disease; there is no evidence that this is unstable. Note that she is managing facility. #2 history of multi-infarct dementia. #3 hypertensive heart and chronic kidney disease. Labs stable #4 type 2 diabetes with most recent hemoglobin A1c 7.1

## 2012-12-22 ENCOUNTER — Non-Acute Institutional Stay (SKILLED_NURSING_FACILITY): Payer: PRIVATE HEALTH INSURANCE | Admitting: Internal Medicine

## 2012-12-22 DIAGNOSIS — I209 Angina pectoris, unspecified: Secondary | ICD-10-CM

## 2012-12-22 NOTE — Progress Notes (Signed)
Patient ID: [name] [name], female   DOB: December 30, 1926, 79 y.o.   MRN: [id number]         Patient ID: [name] [name], female   DOB: October 05, 1926, 79 y.o.   MRN: [id number] [facility]; [name]. Chief complaint; routine [facility] visit. For july History; the patient has been here since 2010. She is a most problematic history of significant coronary artery disease, status post stent placement in a type II diabetic care. She is known macrovascular disease with peripheral vascular disease and carotid artery disease. She has episodic falls. She is a DO NOT RESUSCITATE and do not hospitalize.  Past medical history/problem. #1 multi-infarct dementia. #2 history of coronary artery disease, status post stent. #3 diabetic retinopathy.   #4 CHF. #5 hypertension. #6 history of psychosis. #7 history of a CVA. #8 gastroesophageal reflux disease. #9 anemia likely chronic disease  Medication list reviewed  Social; she is a DO NOT RESUSCITATE no hospitalization  Review of systems. Weight 156 pound Respiratory; no shortness of breath Cardiac; she is complaining of episodic retrosternal chest pain that radiates to the left shoulder lasting minutes. She states this is similar to her previous heart attack no reference that I can see to the staff being involved at the [facility] no dysphagia no reflux symptoms no abdominal pain  Physical exam temperature is 98.8, pulse 62, O2 sat 94% on room air. Respiratory clear entry bilaterally. Cardiac heart sounds are normal. No gallops. Mild lower extremity edema. No signs of CHF Abdomen somewhat distended, however, no liver no spleen no masses.  Impression/plan #1 coronary artery disease; the patient talks to me about chest pain today yet I can find no particular reference to this in discussion with the t the staff. She is on atenolol and Norvasc. She is not on a nitrate. Her blood pressure is stable pulse in the low 60s but I don't think she could tolerate any  increasing the atenolol currently 75 mg #2 history of multi-infarct dementia. #3 hypertensive heart and chronic kidney disease. Labs stable #4 type 2 diabetes with most recent hemoglobin A1c 7.1.  I will start her on Imdur at 30 mg. EKG will be ordered.her physical exam is normal I note the DO NOT RESUSCITATE status and treat in facility

## 2013-01-01 ENCOUNTER — Encounter: Payer: Self-pay | Admitting: Nurse Practitioner

## 2013-01-01 ENCOUNTER — Ambulatory Visit (INDEPENDENT_AMBULATORY_CARE_PROVIDER_SITE_OTHER): Payer: PRIVATE HEALTH INSURANCE | Admitting: Nurse Practitioner

## 2013-01-01 VITALS — BP 142/58 | HR 64 | Ht 62.0 in | Wt 153.4 lb

## 2013-01-01 DIAGNOSIS — I259 Chronic ischemic heart disease, unspecified: Secondary | ICD-10-CM

## 2013-01-01 DIAGNOSIS — I10 Essential (primary) hypertension: Secondary | ICD-10-CM

## 2013-01-01 DIAGNOSIS — E785 Hyperlipidemia, unspecified: Secondary | ICD-10-CM

## 2013-01-01 NOTE — Patient Instructions (Addendum)
We are checking labs today  Continue with the current medicines  EKG today shows sinus rhythm with a left bundle branch block - this is chronic  We will see back prn.  Call the [facility] office at [phone] if you have any questions, problems or concerns.

## 2013-01-01 NOTE — Progress Notes (Signed)
[name] [name] Date of Birth: 1927-02-17 Medical Record #[id number]  History of Present Illness: [name] [name] is seen back today for a work in visit. Seen for [name] [name]. Has not been here in over 2 years. She is 79 years of age. Has known CAD with cath in 2009 and PCI of the RCA at that time and otherwise nonobstructive disease. EF has been normal. Has mild to moderate TR per echo 2 years ago in 2012. Other issues include multi infarct dementia, CVA, GERD, anemia of chronic disease, psychosis, HTN, HLD, depression, polymyalgia and obesity. Basically negative carotid doppler in 2012. She is a DNR.   Referred by PCP for her facility - apparently endorsed having chest pain - EKG was ordered - Imdur was started.   Comes in today. Here alone. Not really able to tell me much history. Thinks it is April. Tells me an address when asked who the president is. Says she has had some pain off and on, but none for the past several days. Not active - very sedentary. Tells me she has fallen out of bed. Not short of breath.   Current Outpatient Prescriptions  Medication Sig Dispense Refill  . acetaminophen (TYLENOL) 325 MG tablet Take 650 mg by mouth every 6 (six) hours as needed for pain.      Marland Kitchen acetaminophen (TYLENOL) 500 MG tablet Take 500 mg by mouth every 6 (six) hours as needed for pain.      Marland Kitchen aluminum & magnesium hydroxide-simethicone (MYLANTA) 500-450-40 MG/5ML suspension Take by mouth every 6 (six) hours as needed for indigestion.      Marland Kitchen amLODipine (NORVASC) 10 MG tablet Take 10 mg by mouth daily.      Marland Kitchen aspirin EC 81 MG tablet Take 1 tablet (81 mg total) by mouth daily.      Marland Kitchen atenolol (TENORMIN) 25 MG tablet Take 25 mg by mouth 3 (three) times daily.       . cetirizine (ZYRTEC) 10 MG tablet Take 10 mg by mouth daily.      . chlorhexidine (PERIDEX) 0.12 % solution Use as directed 15 mLs in the mouth or throat 2 (two) times daily.      . Cranberry-Vitamin C-Inulin (UTI-STAT) LIQD Take by mouth  daily.      . diazepam (VALIUM) 5 MG tablet Take 1/2 tablet by mouth once daily at 8pm  15 tablet  5  . ergocalciferol (VITAMIN D2) 50000 UNITS capsule Take 50,000 Units by mouth every 30 (thirty) days.        . ferrous sulfate 325 (65 FE) MG tablet Take 325 mg by mouth daily with breakfast.        . gabapentin (NEURONTIN) 100 MG capsule Take 100 mg by mouth 2 (two) times daily.        . insulin aspart (NOVOLOG) 100 UNIT/ML injection Inject into the skin as directed.        . insulin glargine (LANTUS) 100 UNIT/ML injection Inject into the skin as directed.        Marland Kitchen ipratropium-albuterol (DUONEB) 0.5-2.5 (3) MG/3ML SOLN Take 3 mLs by nebulization every 6 (six) hours as needed.      . isosorbide mononitrate (IMDUR) 30 MG 24 hr tablet Take 30 mg by mouth daily.      . nitroGLYCERIN (NITROSTAT) 0.4 MG SL tablet Place 0.4 mg under the tongue every 5 (five) minutes as needed for chest pain.      . pravastatin (PRAVACHOL) 40 MG tablet Take 5 mg  by mouth every evening.      . pseudoephedrine-dextromethorphan-guaifenesin (ROBITUSSIN-PE) 30-10-100 MG/5ML solution Take 10 mLs by mouth 4 (four) times daily as needed for cough.      . QUEtiapine (SEROQUEL) 100 MG tablet Take 100 mg by mouth 2 (two) times daily.        . ranitidine (ZANTAC) 75 MG tablet Take 75 mg by mouth 2 (two) times daily.      Marland Kitchen senna (SENOKOT) 8.6 MG TABS Take 1 tablet by mouth at bedtime.        . sertraline (ZOLOFT) 100 MG tablet Take by mouth. Take 125mg  daily      . sitaGLIPtin (JANUVIA) 100 MG tablet Take 100 mg by mouth daily.        . traMADol (ULTRAM) 50 MG tablet Take 50 mg by mouth every 6 (six) hours as needed for pain.       No current facility-administered medications for this visit.    Allergies  Allergen Reactions  . Codeine   . Hydrocodone   . Ibuprofen   . Penicillins   . Sulfonamide Derivatives     Past Medical History  Diagnosis Date  . Coronary atherosclerosis of unspecified type of vessel, native or  graft   . Depressive disorder, not elsewhere classified   . Type II or unspecified type diabetes mellitus with neurological manifestations, not stated as uncontrolled(250.60)   . Other and unspecified hyperlipidemia   . Unspecified essential hypertension   . Occlusion and stenosis of carotid artery without mention of cerebral infarction   . Personal history of other diseases of circulatory system   . Unspecified cerebral artery occlusion with cerebral infarction   . Polymyalgia rheumatica   . Obesity, unspecified   . Leukocytosis, unspecified     Past Surgical History  Procedure Laterality Date  . Total abdominal hysterectomy    . Appendectomy    . Cataract surgery      bilateral w intraocular placement  . Refractive surgery      both eyes x6  . Posterior laminectomy / decompression cervical spine      w diskectomy and fusion of c5-c7 and anterior cervical plate  . Surgery of kidney stones      a few years ago    History  Smoking status  . Never Smoker   Smokeless tobacco  . Not on file    History  Alcohol Use No    Family History  Problem Relation Age of Onset  . Coronary artery disease Other     Review of Systems: The review of systems is per the HPI.  All other systems were reviewed and are negative.  Physical Exam: BP 142/58  Pulse 64  Ht 5\' 2"  (1.575 m)  Wt 153 lb 6.4 oz (69.582 kg)  BMI 28.05 kg/m2 Patient is very pleasantly confused but in no acute distress. Skin is warm and dry. Color is normal.  HEENT is unremarkable. Normocephalic/atraumatic. PERRL. Sclera are nonicteric. Neck is supple. No masses. No JVD. Lungs are clear. Cardiac exam shows a regular rate and rhythm. Abdomen is soft. Extremities are without edema. Gait is not tested - she is in a wheelchair.  No gross neurologic deficits noted.  LABORATORY DATA: EKG today shows sinus rhythm with a LBBB - this is chronic.    Chemistry      Component Value Date/Time   NA 136 02/25/2009 2230   K 3.8  02/25/2009 2230   CL 102 02/25/2009 2230   CO2 27  02/25/2009 2230   BUN 21 02/25/2009 2230   CREATININE 0.78 02/25/2009 2230      Component Value Date/Time   CALCIUM 9.6 02/25/2009 2230   ALKPHOS 81 11/19/2008 0615   AST 63* 11/19/2008 0615   ALT 47* 11/19/2008 0615   BILITOT 0.3 11/19/2008 0615     Lab Results  Component Value Date   WBC 12.7* 02/25/2009   HGB 12.1 02/25/2009   HCT 37.1 02/25/2009   MCV 83.2 02/25/2009   PLT 164 02/25/2009   Lab Results  Component Value Date   CHOL  Value: 106        ATP III CLASSIFICATION:  <200     mg/dL   Desirable  161-096  mg/dL   Borderline High  >=045    mg/dL   High        08/07/8117   HDL 28* 11/17/2008   LDLCALC  Value: 53        Total Cholesterol/HDL:CHD Risk Coronary Heart Disease Risk Table                     Men   Women  1/2 Average Risk   3.4   3.3  Average Risk       5.0   4.4  2 X Average Risk   9.6   7.1  3 X Average Risk  23.4   11.0        Use the calculated Patient Ratio above and the CHD Risk Table to determine the patient's CHD Risk.        ATP III CLASSIFICATION (LDL):  <100     mg/dL   Optimal  147-829  mg/dL   Near or Above                    Optimal  130-159  mg/dL   Borderline  562-130  mg/dL   High  >865     mg/dL   Very High 7/84/6962   TRIG 124 11/17/2008   CHOLHDL 3.8 11/17/2008    Assessment / Plan: 1. Chest pain - hard to say if this is cardiac - EKG shows chronic LBBB - would favor conservative management and agree with the addition of nitrate therapy.   2. Dementia  3. HLD  4. HTN  Will update some labs as well today. No change with current therapy. See back prn.   Patient is agreeable to this plan and will call if any problems develop in the interim.   Rosalio Macadamia, RN, ANP-C [facility] [address] [place], [place]  [address]

## 2013-01-02 LAB — HEPATIC FUNCTION PANEL
ALT: 24 U/L (ref 0–35)
AST: 29 U/L (ref 0–37)
Albumin: 4.1 g/dL (ref 3.5–5.2)
Alkaline Phosphatase: 76 U/L (ref 39–117)
Bilirubin, Direct: 0 mg/dL (ref 0.0–0.3)
Total Bilirubin: 0.4 mg/dL (ref 0.3–1.2)
Total Protein: 7.1 g/dL (ref 6.0–8.3)

## 2013-01-02 LAB — BASIC METABOLIC PANEL
BUN: 23 mg/dL (ref 6–23)
CO2: 28 mEq/L (ref 19–32)
Calcium: 9.2 mg/dL (ref 8.4–10.5)
Chloride: 103 mEq/L (ref 96–112)
Creatinine, Ser: 1 mg/dL (ref 0.4–1.2)
GFR: 58.78 mL/min — ABNORMAL LOW (ref >60.00)
Glucose, Bld: 234 mg/dL — ABNORMAL HIGH (ref 70–99)
Potassium: 4.4 mEq/L (ref 3.5–5.1)
Sodium: 136 mEq/L (ref 135–145)

## 2013-01-02 LAB — TSH: TSH: 1.83 u[IU]/mL (ref 0.35–5.50)

## 2013-01-02 LAB — LIPID PANEL
Cholesterol: 117 mg/dL (ref 0–200)
HDL: 30.1 mg/dL — ABNORMAL LOW (ref >39.00)
Total CHOL/HDL Ratio: 4
Triglycerides: 390 mg/dL — ABNORMAL HIGH (ref 0.0–149.0)
VLDL: 78 mg/dL — ABNORMAL HIGH (ref 0.0–40.0)

## 2013-01-02 LAB — LDL CHOLESTEROL, DIRECT: Direct LDL: 65.2 mg/dL

## 2013-01-26 ENCOUNTER — Non-Acute Institutional Stay (SKILLED_NURSING_FACILITY): Payer: PRIVATE HEALTH INSURANCE | Admitting: Internal Medicine

## 2013-01-26 DIAGNOSIS — I209 Angina pectoris, unspecified: Secondary | ICD-10-CM

## 2013-01-26 DIAGNOSIS — N183 Chronic kidney disease, stage 3 unspecified: Secondary | ICD-10-CM

## 2013-01-26 DIAGNOSIS — I509 Heart failure, unspecified: Secondary | ICD-10-CM

## 2013-01-26 NOTE — Progress Notes (Signed)
          Patient ID: [name] [name], female   DOB: January 01, 1927, 79 y.o.   MRN: [id number] [name]. Chief complaint; routine [facility] visit. For august History; the patient has been here since 2010. She is a most problematic history of significant coronary artery disease, status post stent placement in a type II diabetic care. She is known macrovascular disease with peripheral vascular disease and carotid artery disease. She has episodic falls. She is a DO NOT RESUSCITATE and do not hospitalize.  When I saw her last month she described chest pain radiating into her arm. I ordered an EKG which shows a left bundle branch block possible left ventricular hypertrophy she has been to see cardiology did not add anything new to her regimen agreed with the nitrates. Of note she is a DO NOT RESUSCITATE and do not hospitalize. Her lab work was updated recently including a hemoglobin A1c of 7.1 her LDH was 61. She has not described any chest pain  Past medical history/problem. #1 multi-infarct dementia. #2 history of coronary artery disease, status post stent. #3 diabetic retinopathy.   #4 CHF. #5 hypertension. #6 history of psychosis. #7 history of a CVA. #8 gastroesophageal reflux disease. #9 anemia likely chronic disease  Medication list reviewed  Social; she is a DO NOT RESUSCITATE no hospitalization  Review of systems. Weight 156 pound Respiratory; no shortness of breath Cardiac; no current complaints of chest pain GI no dysphagia no reflux symptoms no abdominal pain  Physical exam temperature is 98.8, pulse 62, O2 sat 94% on room air. Respiratory clear entry bilaterally. Cardiac heart sounds are normal. No gallops. Mild lower extremity edema. No signs of CHF Abdomen somewhat distended, however, no liver no spleen no masses.  Impression/plan #1 coronary artery disease; no further complaints of chest pain. #2 history of multi-infarct dementia. #3 hypertensive heart and  chronic kidney disease. Labs stable #4 type 2 diabetes with most recent hemoglobin A1c 7.1.

## 2013-02-15 ENCOUNTER — Non-Acute Institutional Stay (SKILLED_NURSING_FACILITY): Payer: PRIVATE HEALTH INSURANCE | Admitting: Internal Medicine

## 2013-02-15 DIAGNOSIS — N183 Chronic kidney disease, stage 3 unspecified: Secondary | ICD-10-CM

## 2013-02-15 DIAGNOSIS — I209 Angina pectoris, unspecified: Secondary | ICD-10-CM

## 2013-02-15 NOTE — Progress Notes (Signed)
           Patient ID: [name] [name], female   DOB: 10/22/1926, 79 y.o.   MRN: [id number] [facility]; [name]. Chief complaint; routine [facility] visit. For September History; the patient has been here since 2010. She has a most problematic history of significant coronary artery disease, status post stent placement in a type II diabetic care. She is known macrovascular disease with peripheral vascular disease and carotid artery disease. She has episodic falls. She is a DO NOT RESUSCITATE and do not hospitalize.  When I saw her2 months ago she described chest pain radiating into her arm. I ordered an EKG which shows a left bundle branch block possible left ventricular hypertrophy she has been to see cardiology did not add anything new to her regimen agreed with the nitrates. Of note she is a DO NOT RESUSCITATE and do not hospitalize. Her lab work was updated recently including a hemoglobin A1c of 7.1 her LDH was 40. She has not described any chest pain  Past medical history/problem. #1 multi-infarct dementia. #2 history of coronary artery disease, status post stent. #3 diabetic retinopathy.   #4 CHF. #5 hypertension. #6 history of psychosis. #7 history of a CVA. #8 gastroesophageal reflux disease. #9 anemia likely chronic disease  Medication list reviewed  Social; she is a DO NOT RESUSCITATE no hospitalization  Review of systems. Weight 156 pound Respiratory; no shortness of breath Cardiac; no current complaints of chest pain GI no dysphagia no reflux symptoms no abdominal pain  Physical exam temperature is 98.8, pulse 62, O2 sat 94% on room air. Respiratory clear entry bilaterally. Cardiac heart sounds are normal. No gallops. Mild lower extremity edema. No signs of CHF Abdomen somewhat distended, however, no liver no spleen no masses.  Impression/plan #1 coronary artery disease; no further complaints of chest pain. #2 history of multi-infarct dementia. #3 hypertensive  heart and chronic kidney disease. Labs stable #4 type 2 diabetes with most recent hemoglobin A1c 7.1.

## 2013-02-17 ENCOUNTER — Other Ambulatory Visit: Payer: Self-pay | Admitting: *Deleted

## 2013-02-17 MED ORDER — DIAZEPAM 5 MG PO TABS: ORAL_TABLET | ORAL | Status: DC

## 2013-02-17 NOTE — Telephone Encounter (Signed)
Refilled pre protocol

## 2013-03-22 ENCOUNTER — Non-Acute Institutional Stay (SKILLED_NURSING_FACILITY): Payer: PRIVATE HEALTH INSURANCE | Admitting: Internal Medicine

## 2013-03-22 DIAGNOSIS — I130 Hypertensive heart and chronic kidney disease with heart failure and stage 1 through stage 4 chronic kidney disease, or unspecified chronic kidney disease: Secondary | ICD-10-CM

## 2013-03-22 DIAGNOSIS — I209 Angina pectoris, unspecified: Secondary | ICD-10-CM

## 2013-03-22 DIAGNOSIS — I509 Heart failure, unspecified: Secondary | ICD-10-CM

## 2013-03-22 NOTE — Progress Notes (Signed)
            Patient ID: [name] [name], female   DOB: 1926/10/06, 79 y.o.   MRN: [id number] [name]. Chief complaint; routine [facility] visit for October History; the patient has been here since 2010. She has a most problematic history of significant coronary artery disease, status post stent placement in a type II diabetic care. She is known macrovascular disease with peripheral vascular disease and carotid artery disease. She has episodic falls. She is a DO NOT RESUSCITATE and do not hospitalize.  She has not had any recent problems over the last 2 months  When I saw her 3 months ago she described chest pain radiating into her arm. I ordered an EKG which shows a left bundle branch block possible left ventricular hypertrophy she has been to see cardiology did not add anything new to her regimen agreed with the nitrates. Of note she is a DO NOT RESUSCITATE and do not hospitalize. Her lab work was updated recently including a hemoglobin A1c of 7.5 her LDH was 42. She has not described any chest pain  Past medical history/problem. #1 multi-infarct dementia. #2 history of coronary artery disease, status post stent. #3 diabetic retinopathy.   #4 CHF. #5 hypertension. #6 history of psychosis. #7 history of a CVA. #8 gastroesophageal reflux disease. #9 anemia likely chronic disease  Medication list reviewed  Social; she is a DO NOT RESUSCITATE no hospitalization  Review of systems. Weight 156 pound Respiratory; no shortness of breath Cardiac; no current complaints of chest pain GI no dysphagia no reflux symptoms no abdominal pain  Physical exam temperature is 98.8, pulse 62, O2 sat 94% on room air. Respiratory clear entry bilaterally. Cardiac heart sounds are normal. No gallops. Mild lower extremity edema. No signs of CHF Abdomen somewhat distended, however, no liver no spleen no masses.  Impression/plan #1 coronary artery disease; no further complaints of chest  pain. #2 history of multi-infarct dementia. #3 hypertensive heart and chronic kidney disease. Labs stable #4 type 2 diabetes with most recent hemoglobin A1c 7.5. This is not unsatisfactory for a patient in this setting.

## 2013-04-18 ENCOUNTER — Non-Acute Institutional Stay (SKILLED_NURSING_FACILITY): Payer: PRIVATE HEALTH INSURANCE | Admitting: Internal Medicine

## 2013-04-18 DIAGNOSIS — I130 Hypertensive heart and chronic kidney disease with heart failure and stage 1 through stage 4 chronic kidney disease, or unspecified chronic kidney disease: Secondary | ICD-10-CM

## 2013-04-18 DIAGNOSIS — I509 Heart failure, unspecified: Secondary | ICD-10-CM

## 2013-04-18 NOTE — Progress Notes (Signed)
Patient ID: [name] [name], female   DOB: 1927/03/05, 79 y.o.   MRN: [id number]            Patient ID: [name] [name], female   DOB: [birth year], 79 y.o.   MRN: [id number] [name] Chief complaint; routine [employer] visit for November History; the patient has been here since 2010. She has a most problematic history of significant coronary artery disease, status post stent placement in a type II diabetic care. She is known macrovascular disease with peripheral vascular disease and carotid artery disease. She has episodic falls. She is a DO NOT RESUSCITATE and do not hospitalize.  She has not had any recent problems over the last 2 months  Resident had an Escherichia coli UTI this month. There does not appear to be any other issues. Past medical history/problem. #1 multi-infarct dementia. #2 history of coronary artery disease, status post stent. #3 diabetic retinopathy.    #4 CHF. #5 hypertension. #6 history of psychosis. #7 history of a CVA. #8 gastroesophageal reflux disease. #9 anemia likely chronic disease  Medication list reviewed  Social; she is a DO NOT RESUSCITATE no hospitalization  Review of systems. Weight 152.6 pound Respiratory; no shortness of breath Cardiac; no current complaints of chest pain GI no dysphagia no reflux symptoms no abdominal pain  Physical exam temperature is 98.8, pulse 62, O2 sat 94% on room air. Respiratory clear entry bilaterally. Cardiac heart sounds are normal. No gallops. Mild lower extremity edema. No signs of CHF Abdomen somewhat distended, however, no liver no spleen no masses.  Impression/plan #1 coronary artery disease; no further complaints of chest pain. #2 history of multi-infarct dementia. She has unfortunately lost her husband and roommate this month. We'll have to watch her closely as she progresses through her grief reaction in the face of her cognitive impairment #3 hypertensive heart and chronic kidney disease.  Labs stable #4 type 2 diabetes with most recent hemoglobin A1c 7.5. This is not unsatisfactory for a patient in this setting.

## 2013-05-31 ENCOUNTER — Non-Acute Institutional Stay (SKILLED_NURSING_FACILITY): Payer: PRIVATE HEALTH INSURANCE | Admitting: Internal Medicine

## 2013-05-31 DIAGNOSIS — N183 Chronic kidney disease, stage 3 unspecified: Secondary | ICD-10-CM

## 2013-05-31 DIAGNOSIS — I509 Heart failure, unspecified: Secondary | ICD-10-CM

## 2013-05-31 NOTE — Progress Notes (Signed)
            Patient ID: [name] [name], female   DOB: 01/18/1927, 80 y.o.   MRN: [id number] [name] Chief complaint; routine [facility] visit for December History; the patient has been here since 2010. She has a most problematic history of significant coronary artery disease, status post stent placement in a type II diabetic care. She is known macrovascular disease with peripheral vascular disease and carotid artery disease. She has episodic falls. She is a DO NOT RESUSCITATE and do not hospitalize.  She has not had any recent problems over the last 2 months  She has had difficulties related to the recent passing of her husband who is also her roommate in the facility. She has been seen by psychiatry with an increase in her Zoloft  Resident had an Escherichia coli UTI this month. There does not appear to be any other issues. Past medical history/problem. #1 multi-infarct dementia. #2 history of coronary artery disease, status post stent. #3 diabetic retinopathy.    #4 CHF. #5 hypertension. #6 history of psychosis. #7 history of a CVA. #8 gastroesophageal reflux disease. #9 anemia likely chronic disease  Medication list reviewed  Social; she is a DO NOT RESUSCITATE no hospitalization  Review of systems. Weight 152.6 pound Respiratory; no shortness of breath Cardiac; no current complaints of chest pain GI no dysphagia no reflux symptoms no abdominal pain  Physical exam temperature is 98.8, pulse 62, O2 sat 94% on room air. Respiratory clear entry bilaterally. Cardiac heart sounds are normal. No gallops. Mild lower extremity edema. No signs of CHF Abdomen somewhat distended, however, no liver no spleen no masses.  Impression/plan #1 coronary artery disease; no further complaints of chest pain. #2 history of multi-infarct dementia. She has unfortunately lost her husband and roommate this month. We'll have to watch her closely as she progresses through her grief  reaction in the face of her cognitive impairment #3 hypertensive heart and chronic kidney disease. Labs stable #4 type 2 diabetes with most recent hemoglobin A1c 7.5. This is not unsatisfactory for a patient in this setting.

## 2013-07-08 ENCOUNTER — Non-Acute Institutional Stay (SKILLED_NURSING_FACILITY): Payer: PRIVATE HEALTH INSURANCE | Admitting: Internal Medicine

## 2013-07-08 DIAGNOSIS — E1129 Type 2 diabetes mellitus with other diabetic kidney complication: Secondary | ICD-10-CM

## 2013-07-08 DIAGNOSIS — N189 Chronic kidney disease, unspecified: Principal | ICD-10-CM

## 2013-07-08 DIAGNOSIS — I130 Hypertensive heart and chronic kidney disease with heart failure and stage 1 through stage 4 chronic kidney disease, or unspecified chronic kidney disease: Secondary | ICD-10-CM

## 2013-07-08 DIAGNOSIS — E1165 Type 2 diabetes mellitus with hyperglycemia: Secondary | ICD-10-CM

## 2013-07-08 DIAGNOSIS — I509 Heart failure, unspecified: Secondary | ICD-10-CM

## 2013-07-08 NOTE — Progress Notes (Signed)
Patient ID: [name] [name], female   DOB: 03/28/1927, 80 y.o.   MRN: [id number]            Patient ID: [name] [name], female   DOB: 02/16/1927, 80 y.o.   MRN: [id number] [name] Chief complaint; routine [employer] visit for February History; the patient has been here since 2010. She has a most problematic history of significant coronary artery disease, status post stent placement in a type II diabetic care. She is known macrovascular disease with peripheral vascular disease and carotid artery disease. She has episodic falls. She is a DO NOT RESUSCITATE and do not hospitalize.  She has not had any recent problems over the last 2 months  She has had difficulties related to the recent passing of her husband who is also her roommate in the facility. She has been seen by psychiatry with an increase in her Zoloft  Lab work; from February 13 showed a of 11.4 white count of 9.5 with normal differential LDL 132 hemoglobin A1c at 7.4  Past medical history/problem. #1 multi-infarct dementia. #2 history of coronary artery disease, status post stent. #3 diabetic retinopathy.   #4 CHF. #5 hypertension. #6 history of psychosis. #7 history of a CVA. #8 gastroesophageal reflux disease. #9 anemia likely chronic disease  Medication list reviewed  Social; she is a DO NOT RESUSCITATE no hospitalization  Review of systems:  Respiratory; no shortness of breath Cardiac; no current complaints of chest pain GI no dysphagia no reflux symptoms no abdominal pain  Physical exam; temperature 98.1 pulse 66 respirations 18 blood pressure 138/56 weight is 153.4 pounds O2 sat 96% on room air Respiratory clear entry bilaterally. Cardiac heart sounds are normal. No gallops. Mild lower extremity edema. No signs of CHF Abdomen somewhat distended, however, no liver no spleen no masses.  Impression/plan #1 coronary artery disease; no further complaints of chest pain. #2 history of multi-infarct  dementia. She has unfortunately lost her husband and roommate recently. We'll have to watch her closely as she progresses through her grief reaction in the face of her cognitive impairment #3 hypertensive heart and chronic kidney disease. Labs stable #4 type 2 diabetes with most recent hemoglobin A1c 7.4. This is not unsatisfactory for a patient in this setting.

## 2013-08-19 ENCOUNTER — Other Ambulatory Visit: Payer: Self-pay | Admitting: *Deleted

## 2013-08-19 MED ORDER — DIAZEPAM 5 MG PO TABS: ORAL_TABLET | ORAL | Status: DC

## 2013-08-19 NOTE — Telephone Encounter (Signed)
[facility] 

## 2013-08-25 ENCOUNTER — Non-Acute Institutional Stay (SKILLED_NURSING_FACILITY): Payer: PRIVATE HEALTH INSURANCE | Admitting: Internal Medicine

## 2013-08-25 DIAGNOSIS — E1129 Type 2 diabetes mellitus with other diabetic kidney complication: Secondary | ICD-10-CM

## 2013-08-25 DIAGNOSIS — I130 Hypertensive heart and chronic kidney disease with heart failure and stage 1 through stage 4 chronic kidney disease, or unspecified chronic kidney disease: Secondary | ICD-10-CM

## 2013-08-25 DIAGNOSIS — E1165 Type 2 diabetes mellitus with hyperglycemia: Secondary | ICD-10-CM

## 2013-08-25 DIAGNOSIS — I509 Heart failure, unspecified: Principal | ICD-10-CM

## 2013-08-25 DIAGNOSIS — N189 Chronic kidney disease, unspecified: Principal | ICD-10-CM

## 2013-08-25 NOTE — Progress Notes (Signed)
             Patient ID: [name] [name], female   DOB: 05/19/1926, 80 y.o.   MRN: [id number] [name] Chief complaint; routine [facility] visit for March History; the patient has been here since 2010. She has a most problematic history of significant coronary artery disease, status post stent placement in a type II diabetic. She is known macrovascular disease with peripheral vascular disease and carotid artery disease. She has episodic falls. She is a DO NOT RESUSCITATE and do not hospitalize. Was treated for a UTI with Cipro this month. Culture showed greater than 100,000 Klebsiella  She has had difficulties related to the recent passing of her husband who is also her roommate in the facility. She has been seen by psychiatry with an increase in her Zoloft  Lab work; from February 13 showed a of 11.4 white count of 9.5 with normal differential LDL 132 hemoglobin A1c at 7.4  Past medical history/problem. #1 multi-infarct dementia. #2 history of coronary artery disease, status post stent. #3 diabetic retinopathy.   #4 CHF. #5 hypertension. #6 history of psychosis. #7 history of a CVA. #8 gastroesophageal reflux disease. #9 anemia likely chronic disease  Medication list reviewed  Social; she is a DO NOT RESUSCITATE no hospitalization  Review of systems:  Respiratory; no shortness of breath Cardiac; no current complaints of chest pain GI no dysphagia no reflux symptoms no abdominal pain  Physical exam; temperature temperature 97.0-pulse 70-respirations 18-blood pressure 153/68-weight 152.6 pounds-O2 sat 96% on room air.  Respiratory clear entry bilaterally. Cardiac heart sounds are normal. No gallops. Mild lower extremity edema. No signs of CHF Abdomen somewhat distended, however, no liver no spleen no masses.  Impression/plan #1 coronary artery disease; no further complaints of chest pain. #2 history of multi-infarct dementia. She has unfortunately lost her husband  and roommate recently. We'll have to watch her closely as she progresses through her grief reaction in the face of her cognitive impairment #3 hypertensive heart and chronic kidney disease. Labs stable #4 type 2 diabetes with most recent hemoglobin A1c 7.4. This is not unsatisfactory for a patient in this setting.

## 2013-09-20 ENCOUNTER — Non-Acute Institutional Stay (SKILLED_NURSING_FACILITY): Payer: PRIVATE HEALTH INSURANCE | Admitting: Internal Medicine

## 2013-09-20 DIAGNOSIS — I509 Heart failure, unspecified: Secondary | ICD-10-CM

## 2013-09-20 DIAGNOSIS — E1165 Type 2 diabetes mellitus with hyperglycemia: Principal | ICD-10-CM

## 2013-09-20 DIAGNOSIS — E1129 Type 2 diabetes mellitus with other diabetic kidney complication: Secondary | ICD-10-CM

## 2013-09-20 NOTE — Progress Notes (Signed)
              Patient ID: [name] [name], female   DOB: 05-11-1926, 80 y.o.   MRN: [id number] Facility; [name]. Chief complaint; routine [employer] visit for April History; the patient has been here since 2010. She has a most problematic history of significant coronary artery disease, status post stent placement in a type II diabetic. She is known macrovascular disease with peripheral vascular disease and carotid artery disease. She has episodic falls. She is a DO NOT RESUSCITATE and do not hospitalize. Was treated for a UTI with Cipro this month. Culture showed greater than 100,000 Klebsiella  She has had difficulties related to the recent passing of her husband who is also her roommate in the facility. She has been seen by psychiatry with an increase in her Cymbalta and Valium  Lab work; from May 6 showed a hemoglobin A1c of 9.1, white count of 11.1 hemoglobin is 12.6 platelet count 144  Past medical history/problem. #1 multi-infarct dementia. #2 history of coronary artery disease, status post stent. #3 diabetic retinopathy.   #4 CHF. #5 hypertension. #6 history of psychosis. #7 history of a CVA. #8 gastroesophageal reflux disease. #9 anemia likely chronic disease  #10 Lantus insulin 82 units at bedtime and NovoLog insulin 15 units a.c. breakfast and noon and 13 units at dinner  Medication list reviewed  Social; she is a DO NOT RESUSCITATE no hospitalization  Review of systems:  Respiratory; no shortness of breath Cardiac; no current complaints of chest pain GI no dysphagia no reflux symptoms no abdominal pain  Physical exam; temperature temperature 97.0-pulse 74-respirations 18-blood pressure 153/68-weight 157.6 pounds-O2 sat 96% on room air.  Respiratory clear entry bilaterally. Cardiac heart sounds are normal. No gallops. Mild lower extremity edema. No signs of CHF Abdomen somewhat distended, however, no liver no spleen no masses.  Impression/plan #1 coronary  artery disease; no further complaints of chest pain. #2 history of multi-infarct dementia. She has unfortunately lost her husband and roommate recently. We'll have to watch her closely as she progresses through her grief reaction in the face of her cognitive impairment #3 hypertensive heart and chronic kidney disease. Labs stable #4 type 2 diabetes with most recent hemoglobin A1c 9.1. This is not unsatisfactory for a patient in this setting. She has had slight increase in her Lantus to 8 units and is now on NovoLog 15 units at breakfast and noon

## 2013-09-29 ENCOUNTER — Emergency Department (HOSPITAL_COMMUNITY): Payer: PRIVATE HEALTH INSURANCE

## 2013-09-29 ENCOUNTER — Encounter (HOSPITAL_COMMUNITY): Payer: Self-pay | Admitting: Emergency Medicine

## 2013-09-29 ENCOUNTER — Emergency Department (HOSPITAL_COMMUNITY)
Admission: EM | Admit: 2013-09-29 | Discharge: 2013-09-29 | Disposition: A | Discharge status: Home / Self Care | Payer: PRIVATE HEALTH INSURANCE | Attending: Emergency Medicine | Admitting: Emergency Medicine

## 2013-09-29 DIAGNOSIS — Z7982 Long term (current) use of aspirin: Secondary | ICD-10-CM | POA: Diagnosis not present

## 2013-09-29 DIAGNOSIS — I2510 Atherosclerotic heart disease of native coronary artery without angina pectoris: Secondary | ICD-10-CM | POA: Insufficient documentation

## 2013-09-29 DIAGNOSIS — I10 Essential (primary) hypertension: Secondary | ICD-10-CM | POA: Insufficient documentation

## 2013-09-29 DIAGNOSIS — E669 Obesity, unspecified: Secondary | ICD-10-CM | POA: Diagnosis not present

## 2013-09-29 DIAGNOSIS — Z862 Personal history of diseases of the blood and blood-forming organs and certain disorders involving the immune mechanism: Secondary | ICD-10-CM | POA: Diagnosis not present

## 2013-09-29 DIAGNOSIS — E1149 Type 2 diabetes mellitus with other diabetic neurological complication: Secondary | ICD-10-CM | POA: Diagnosis not present

## 2013-09-29 DIAGNOSIS — Y939 Activity, unspecified: Secondary | ICD-10-CM | POA: Insufficient documentation

## 2013-09-29 DIAGNOSIS — S40019A Contusion of unspecified shoulder, initial encounter: Secondary | ICD-10-CM | POA: Diagnosis not present

## 2013-09-29 DIAGNOSIS — S7000XA Contusion of unspecified hip, initial encounter: Secondary | ICD-10-CM | POA: Insufficient documentation

## 2013-09-29 DIAGNOSIS — E785 Hyperlipidemia, unspecified: Secondary | ICD-10-CM | POA: Diagnosis not present

## 2013-09-29 DIAGNOSIS — S6990XA Unspecified injury of unspecified wrist, hand and finger(s), initial encounter: Secondary | ICD-10-CM

## 2013-09-29 DIAGNOSIS — Z8739 Personal history of other diseases of the musculoskeletal system and connective tissue: Secondary | ICD-10-CM | POA: Diagnosis not present

## 2013-09-29 DIAGNOSIS — E119 Type 2 diabetes mellitus without complications: Secondary | ICD-10-CM | POA: Diagnosis not present

## 2013-09-29 DIAGNOSIS — W19XXXA Unspecified fall, initial encounter: Secondary | ICD-10-CM | POA: Diagnosis not present

## 2013-09-29 DIAGNOSIS — F0390 Unspecified dementia without behavioral disturbance: Secondary | ICD-10-CM | POA: Diagnosis not present

## 2013-09-29 DIAGNOSIS — F329 Major depressive disorder, single episode, unspecified: Secondary | ICD-10-CM | POA: Insufficient documentation

## 2013-09-29 DIAGNOSIS — Z880 Allergy status to penicillin: Secondary | ICD-10-CM | POA: Diagnosis not present

## 2013-09-29 DIAGNOSIS — S0993XA Unspecified injury of face, initial encounter: Secondary | ICD-10-CM | POA: Insufficient documentation

## 2013-09-29 DIAGNOSIS — Z8673 Personal history of transient ischemic attack (TIA), and cerebral infarction without residual deficits: Secondary | ICD-10-CM | POA: Insufficient documentation

## 2013-09-29 DIAGNOSIS — F3289 Other specified depressive episodes: Secondary | ICD-10-CM | POA: Diagnosis not present

## 2013-09-29 DIAGNOSIS — Z79899 Other long term (current) drug therapy: Secondary | ICD-10-CM | POA: Diagnosis not present

## 2013-09-29 DIAGNOSIS — S59919A Unspecified injury of unspecified forearm, initial encounter: Secondary | ICD-10-CM

## 2013-09-29 DIAGNOSIS — S199XXA Unspecified injury of neck, initial encounter: Secondary | ICD-10-CM

## 2013-09-29 DIAGNOSIS — S4980XA Other specified injuries of shoulder and upper arm, unspecified arm, initial encounter: Secondary | ICD-10-CM | POA: Diagnosis present

## 2013-09-29 DIAGNOSIS — S59909A Unspecified injury of unspecified elbow, initial encounter: Secondary | ICD-10-CM | POA: Insufficient documentation

## 2013-09-29 DIAGNOSIS — S7001XA Contusion of right hip, initial encounter: Secondary | ICD-10-CM

## 2013-09-29 DIAGNOSIS — Y9210 Unspecified residential institution as the place of occurrence of the external cause: Secondary | ICD-10-CM | POA: Diagnosis not present

## 2013-09-29 DIAGNOSIS — Z794 Long term (current) use of insulin: Secondary | ICD-10-CM | POA: Insufficient documentation

## 2013-09-29 DIAGNOSIS — S40011A Contusion of right shoulder, initial encounter: Secondary | ICD-10-CM

## 2013-09-29 DIAGNOSIS — M25521 Pain in right elbow: Secondary | ICD-10-CM

## 2013-09-29 DIAGNOSIS — S46909A Unspecified injury of unspecified muscle, fascia and tendon at shoulder and upper arm level, unspecified arm, initial encounter: Secondary | ICD-10-CM | POA: Diagnosis present

## 2013-09-29 LAB — URINALYSIS, ROUTINE W REFLEX MICROSCOPIC
BILIRUBIN URINE: NEGATIVE
GLUCOSE, UA: NEGATIVE mg/dL
HGB URINE DIPSTICK: NEGATIVE
KETONES UR: NEGATIVE mg/dL
LEUKOCYTES UA: NEGATIVE
Nitrite: NEGATIVE
Protein, ur: NEGATIVE mg/dL
Specific Gravity, Urine: 1.01 (ref 1.005–1.030)
Urobilinogen, UA: 0.2 mg/dL (ref 0.0–1.0)
pH: 7 (ref 5.0–8.0)

## 2013-09-29 LAB — CBC WITH DIFFERENTIAL/PLATELET
BASOS PCT: 0 % (ref 0–1)
Basophils Absolute: 0 10*3/uL (ref 0.0–0.1)
Eosinophils Absolute: 0.2 10*3/uL (ref 0.0–0.7)
Eosinophils Relative: 2 % (ref 0–5)
HCT: 37.6 % (ref 36.0–46.0)
Hemoglobin: 12.3 g/dL (ref 12.0–15.0)
LYMPHS ABS: 4.2 10*3/uL — AB (ref 0.7–4.0)
Lymphocytes Relative: 38 % (ref 12–46)
MCH: 30.9 pg (ref 26.0–34.0)
MCHC: 32.7 g/dL (ref 30.0–36.0)
MCV: 94.5 fL (ref 78.0–100.0)
Monocytes Absolute: 0.7 10*3/uL (ref 0.1–1.0)
Monocytes Relative: 7 % (ref 3–12)
NEUTROS PCT: 53 % (ref 43–77)
Neutro Abs: 5.7 10*3/uL (ref 1.7–7.7)
PLATELETS: 141 10*3/uL — AB (ref 150–400)
RBC: 3.98 MIL/uL (ref 3.87–5.11)
RDW: 13.7 % (ref 11.5–15.5)
WBC: 10.8 10*3/uL — ABNORMAL HIGH (ref 4.0–10.5)

## 2013-09-29 LAB — COMPREHENSIVE METABOLIC PANEL
ALT: 44 U/L — ABNORMAL HIGH (ref 0–35)
AST: 49 U/L — AB (ref 0–37)
Albumin: 3.6 g/dL (ref 3.5–5.2)
Alkaline Phosphatase: 104 U/L (ref 39–117)
BUN: 22 mg/dL (ref 6–23)
CHLORIDE: 103 meq/L (ref 96–112)
CO2: 26 mEq/L (ref 19–32)
CREATININE: 0.78 mg/dL (ref 0.50–1.10)
Calcium: 9.6 mg/dL (ref 8.4–10.5)
GFR, EST AFRICAN AMERICAN: 86 mL/min — AB (ref >90)
GFR, EST NON AFRICAN AMERICAN: 74 mL/min — AB (ref >90)
GLUCOSE: 107 mg/dL — AB (ref 70–99)
Potassium: 4.3 mEq/L (ref 3.7–5.3)
Sodium: 140 mEq/L (ref 137–147)
Total Protein: 7.2 g/dL (ref 6.0–8.3)

## 2013-09-29 LAB — CBG MONITORING, ED: GLUCOSE-CAPILLARY: 113 mg/dL — AB (ref 70–99)

## 2013-09-29 MED ORDER — FENTANYL CITRATE 0.05 MG/ML IJ SOLN
12.5000 ug | Freq: Once | INTRAMUSCULAR | Status: AC
  Administered 2013-09-29: 12.5 ug via INTRAVENOUS
  Filled 2013-09-29: qty 2

## 2013-09-29 NOTE — ED Notes (Signed)
Need for urinalysis and EKG is noted, will complete shortly.

## 2013-09-29 NOTE — ED Notes (Signed)
Pt to radiology.

## 2013-09-29 NOTE — ED Notes (Signed)
Pt is resting comfortably.

## 2013-09-29 NOTE — ED Notes (Signed)
IV access attempted x2 without success.  EDPA notified.  Pt c/o only mild pain.  Family at bedside.  NAD.

## 2013-09-29 NOTE — ED Provider Notes (Signed)
CSN: [id number]     Arrival date & time 09/29/13  1523 History   First MD Initiated Contact with Patient 09/29/13 1528     Chief Complaint  Patient presents with  . Fall  . Shoulder Injury  . Neck Injury     (Consider location/radiation/quality/duration/timing/severity/associated sxs/prior Treatment) HPI Comments: The patient is a 80 year old, PMH dementia, CVA, DM currently a resident of [facility] due to an un witnessed fall today.  Per EMS the patient was found laying on her right side by the bed on the floor.  She complains  of neck pain, right shoulder and elbow pain, right hip and low back pain. She reports pain worsen by movement.  The patient is unsure what happened today. Per the patient's son the patient uses a wheelchair at all times. The patient is a DO NOT RESUSCITATE, [place] on chart PCP: Terald Sleeper, MD  Level 5 caveat due to the dementia   Patient is a 80 y.o. female presenting with fall, shoulder injury, and neck injury. The history is provided by the patient and the EMS personnel. No language interpreter was used.  Fall  Shoulder Injury  Neck Injury    Past Medical History  Diagnosis Date  . Coronary atherosclerosis of unspecified type of vessel, native or graft   . Depressive disorder, not elsewhere classified   . Type II or unspecified type diabetes mellitus with neurological manifestations, not stated as uncontrolled   . Other and unspecified hyperlipidemia   . Unspecified essential hypertension   . Occlusion and stenosis of carotid artery without mention of cerebral infarction   . Personal history of other diseases of circulatory system   . Unspecified cerebral artery occlusion with cerebral infarction   . Polymyalgia rheumatica   . Obesity, unspecified   . Leukocytosis, unspecified    Past Surgical History  Procedure Laterality Date  . Total abdominal hysterectomy    . Appendectomy    . Cataract surgery      bilateral w  intraocular placement  . Refractive surgery      both eyes x6  . Posterior laminectomy / decompression cervical spine      w diskectomy and fusion of c5-c7 and anterior cervical plate  . Surgery of kidney stones      a few years ago   Family History  Problem Relation Age of Onset  . Coronary artery disease Other    History  Substance Use Topics  . Smoking status: Never Smoker   . Smokeless tobacco: Not on file  . Alcohol Use: No   OB History   Grav Para Term Preterm Abortions TAB SAB Ect Mult Living                 Review of Systems  Unable to perform ROS: Dementia      Allergies  Codeine; Hydrocodone; Ibuprofen; Penicillins; and Sulfonamide derivatives  Home Medications   Prior to Admission medications   Medication Sig Start Date End Date Taking? Authorizing Provider  acetaminophen (TYLENOL) 325 MG tablet Take 650 mg by mouth every 6 (six) hours as needed for pain.    Historical Provider, MD  acetaminophen (TYLENOL) 500 MG tablet Take 500 mg by mouth every 6 (six) hours as needed for pain.    Historical Provider, MD  aluminum & magnesium hydroxide-simethicone (MYLANTA) 500-450-40 MG/5ML suspension Take by mouth every 6 (six) hours as needed for indigestion.    Historical Provider, MD  amLODipine (NORVASC) 10 MG  tablet Take 10 mg by mouth daily.    Historical Provider, MD  aspirin EC 81 MG tablet Take 1 tablet (81 mg total) by mouth daily. 11/17/10   Lewayne Bunting, MD  atenolol (TENORMIN) 25 MG tablet Take 25 mg by mouth 3 (three) times daily.     Historical Provider, MD  cetirizine (ZYRTEC) 10 MG tablet Take 10 mg by mouth daily.    Historical Provider, MD  chlorhexidine (PERIDEX) 0.12 % solution Use as directed 15 mLs in the mouth or throat 2 (two) times daily.    Historical Provider, MD  Cranberry-Vitamin C-Inulin (UTI-STAT) LIQD Take by mouth daily.    Historical Provider, MD  diazepam (VALIUM) 5 MG tablet Take 1/2 tablet by mouth once daily at 8pm 08/19/13   Kimber Relic, MD  DULoxetine (CYMBALTA) 20 MG capsule Take 20 mg by mouth daily.    Historical Provider, MD  ergocalciferol (VITAMIN D2) 50000 UNITS capsule Take 50,000 Units by mouth every 30 (thirty) days.      Historical Provider, MD  ferrous sulfate 325 (65 FE) MG tablet Take 325 mg by mouth daily with breakfast.      Historical Provider, MD  gabapentin (NEURONTIN) 100 MG capsule Take 100 mg by mouth 2 (two) times daily.      Historical Provider, MD  insulin aspart (NOVOLOG) 100 UNIT/ML injection Inject 13 Units into the skin 3 (three) times daily with meals.     Historical Provider, MD  insulin glargine (LANTUS) 100 UNIT/ML injection Inject 80 Units into the skin at bedtime.     Historical Provider, MD  ipratropium-albuterol (DUONEB) 0.5-2.5 (3) MG/3ML SOLN Take 3 mLs by nebulization every 6 (six) hours as needed.    Historical Provider, MD  isosorbide mononitrate (IMDUR) 30 MG 24 hr tablet Take 30 mg by mouth daily.    Historical Provider, MD  nitroGLYCERIN (NITROSTAT) 0.4 MG SL tablet Place 0.4 mg under the tongue every 5 (five) minutes as needed for chest pain.    Historical Provider, MD  pravastatin (PRAVACHOL) 40 MG tablet Take 5 mg by mouth every evening. 11/17/10 01/01/13  Lewayne Bunting, MD  pseudoephedrine-dextromethorphan-guaifenesin (ROBITUSSIN-PE) 30-10-100 MG/5ML solution Take 10 mLs by mouth 4 (four) times daily as needed for cough.    Historical Provider, MD  QUEtiapine (SEROQUEL) 100 MG tablet Take 100 mg by mouth 2 (two) times daily.      Historical Provider, MD  ranitidine (ZANTAC) 75 MG tablet Take 75 mg by mouth 2 (two) times daily.    Historical Provider, MD  senna (SENOKOT) 8.6 MG TABS Take 1 tablet by mouth at bedtime.      Historical Provider, MD  sertraline (ZOLOFT) 100 MG tablet Take by mouth. Take 125mg  daily    Historical Provider, MD  sitaGLIPtin (JANUVIA) 100 MG tablet Take 100 mg by mouth daily.      Historical Provider, MD  traMADol (ULTRAM) 50 MG tablet Take 50 mg by  mouth every 6 (six) hours as needed for pain.    Historical Provider, MD   BP 161/49  Pulse 66  Temp(Src) 98.4 F (36.9 C)  Resp 18  SpO2 95% Physical Exam  Nursing note and vitals reviewed. Constitutional: She appears well-developed and well-nourished. Cervical collar and backboard in place.  Backboard removed  HENT:  Head: Normocephalic and atraumatic.  Mouth/Throat: Mucous membranes are dry.  No tenderness to palpation  Eyes: EOM are normal. Pupils are equal, round, and reactive to light. Right eye exhibits no  discharge. Left eye exhibits no discharge.  Neck: Normal range of motion. Neck supple. Spinous process tenderness present.  Pulmonary/Chest: Effort normal and breath sounds normal. No respiratory distress. She has no wheezes.  Musculoskeletal:       Right shoulder: She exhibits tenderness. She exhibits no swelling, no crepitus and no deformity.       Right elbow: She exhibits no swelling, no deformity and no laceration. Tenderness found. Olecranon process tenderness noted.       Right hip: She exhibits tenderness. She exhibits no swelling and no deformity.       Right knee: She exhibits normal range of motion. No tenderness found.       Right ankle: She exhibits normal range of motion. No tenderness.  Midline C-spine tenderness palpation, and L-spine tenderness with palpation, no obvious step-offs, crepitus or deformities to the C-spine and L-spine. No T-spine tenderness to palpation o step-offs, crepitus, or deformities noted.  Right hip tenderness to palpation no obvious deformity, specifically no internal or external rotation of the limb, normal length.  Neurological: She is alert. She is disoriented. No cranial nerve deficit or sensory deficit. She exhibits normal muscle tone.  Oriented to person not time or place.  Skin: Skin is warm and dry.    ED Course  Procedures (including critical care time) Labs Review Results for orders placed during the hospital encounter of  09/29/13  URINALYSIS, ROUTINE W REFLEX MICROSCOPIC      Result Value Ref Range   Color, Urine YELLOW  YELLOW   APPearance CLEAR  CLEAR   Specific Gravity, Urine 1.010  1.005 - 1.030   pH 7.0  5.0 - 8.0   Glucose, UA NEGATIVE  NEGATIVE mg/dL   Hgb urine dipstick NEGATIVE  NEGATIVE   Bilirubin Urine NEGATIVE  NEGATIVE   Ketones, ur NEGATIVE  NEGATIVE mg/dL   Protein, ur NEGATIVE  NEGATIVE mg/dL   Urobilinogen, UA 0.2  0.0 - 1.0 mg/dL   Nitrite NEGATIVE  NEGATIVE   Leukocytes, UA NEGATIVE  NEGATIVE  COMPREHENSIVE METABOLIC PANEL      Result Value Ref Range   Sodium 140  137 - 147 mEq/L   Potassium 4.3  3.7 - 5.3 mEq/L   Chloride 103  96 - 112 mEq/L   CO2 26  19 - 32 mEq/L   Glucose, Bld 107 (*) 70 - 99 mg/dL   BUN 22  6 - 23 mg/dL   Creatinine, Ser 8.67  0.50 - 1.10 mg/dL   Calcium 9.6  8.4 - 67.2 mg/dL   Total Protein 7.2  6.0 - 8.3 g/dL   Albumin 3.6  3.5 - 5.2 g/dL   AST 49 (*) 0 - 37 U/L   ALT 44 (*) 0 - 35 U/L   Alkaline Phosphatase 104  39 - 117 U/L   Total Bilirubin <0.2 (*) 0.3 - 1.2 mg/dL   GFR calc non Af Amer 74 (*) >90 mL/min   GFR calc Af Amer 86 (*) >90 mL/min  CBC WITH DIFFERENTIAL      Result Value Ref Range   WBC 10.8 (*) 4.0 - 10.5 K/uL   RBC 3.98  3.87 - 5.11 MIL/uL   Hemoglobin 12.3  12.0 - 15.0 g/dL   HCT 09.4  70.9 - 62.8 %   MCV 94.5  78.0 - 100.0 fL   MCH 30.9  26.0 - 34.0 pg   MCHC 32.7  30.0 - 36.0 g/dL   RDW 36.6  29.4 - 76.5 %  Platelets 141 (*) 150 - 400 K/uL   Neutrophils Relative % 53  43 - 77 %   Neutro Abs 5.7  1.7 - 7.7 K/uL   Lymphocytes Relative 38  12 - 46 %   Lymphs Abs 4.2 (*) 0.7 - 4.0 K/uL   Monocytes Relative 7  3 - 12 %   Monocytes Absolute 0.7  0.1 - 1.0 K/uL   Eosinophils Relative 2  0 - 5 %   Eosinophils Absolute 0.2  0.0 - 0.7 K/uL   Basophils Relative 0  0 - 1 %   Basophils Absolute 0.0  0.0 - 0.1 K/uL  CBG MONITORING, ED      Result Value Ref Range   Glucose-Capillary 113 (*) 70 - 99 mg/dL   Dg Lumbar Spine  Complete  09/29/2013   CLINICAL DATA:  Low back pain following injury  EXAM: LUMBAR SPINE - COMPLETE 4+ VIEW  COMPARISON:  None.  FINDINGS: Five lumbar type vertebral bodies are well visualized. Disc space narrowing is noted at L4-5 with a vacuum disc phenomena. Mild osteophytic changes are seen. No pars defects are noted. Diffuse aortic calcifications are noted.  IMPRESSION: Degenerative change without acute abnormality.   Electronically Signed   By: Alcide CleverMark  Lukens M.D.   On: 09/29/2013 16:43   Dg Clavicle Right  09/29/2013   CLINICAL DATA:  Fall, right clavicular tenderness  EXAM: RIGHT CLAVICLE - 2+ VIEWS  COMPARISON:  Concurrently obtained radiographs of the right shoulder and elbow  FINDINGS: There is no evidence of fracture or other focal bone lesions. Soft tissues are unremarkable.  IMPRESSION: Negative.   Electronically Signed   By: Malachy MoanHeath  McCullough M.D.   On: 09/29/2013 16:42   Dg Shoulder Right  09/29/2013   CLINICAL DATA:  fall  EXAM: RIGHT SHOULDER - 2+ VIEW  COMPARISON:  None.  FINDINGS: There is no evidence of fracture or dislocation. There is hypertrophic spurring involving the humeral head, glenoid, and acromioclavicular joint. Right lung apex unremarkable.  IMPRESSION: Osteoarthritic changes.  No acute osseous abnormalities.   Electronically Signed   By: Salome HolmesHector  Cooper M.D.   On: 09/29/2013 16:44   Dg Elbow Complete Right  09/29/2013   CLINICAL DATA:  Fall.  EXAM: RIGHT ELBOW - COMPLETE 3+ VIEW  COMPARISON:  None.  FINDINGS: There is no evidence of fracture, dislocation, or joint effusion. There is no evidence of arthropathy or other focal bone abnormality. Soft tissues are unremarkable.  IMPRESSION: Negative.   Electronically Signed   By: Maisie Fushomas  Register   On: 09/29/2013 16:43   Dg Hip Complete Right  09/29/2013   CLINICAL DATA:  Fall under right side.  Right hip pain.  EXAM: RIGHT HIP - COMPLETE 2+ VIEW  COMPARISON:  None.  FINDINGS: Frontal view of the pelvis shows no evidence for  fracture. No worrisome lytic or sclerotic osseous abnormality. SI joints are unremarkable.  Frontal and frog-leg lateral views of the right hip are limited by positioning. No evidence for femoral neck fracture. Tensile and compressive trabeculae in the femoral neck are preserved.  IMPRESSION: No acute bony abnormality.   Electronically Signed   By: Kennith CenterEric  Mansell M.D.   On: 09/29/2013 16:44   Ct Head Wo Contrast  09/29/2013   CLINICAL DATA:  Fall with neck injury.  EXAM: CT HEAD WITHOUT CONTRAST  CT CERVICAL SPINE WITHOUT CONTRAST  TECHNIQUE: Multidetector CT imaging of the head and cervical spine was performed following the standard protocol without intravenous contrast. Multiplanar CT image reconstructions  of the cervical spine were also generated.  COMPARISON:  06/14/2012  FINDINGS: CT HEAD FINDINGS  Stable atrophy and significant underlying small vessel disease. The brain demonstrates no evidence of hemorrhage, infarction, edema, mass effect, extra-axial fluid collection, hydrocephalus or mass lesion. The skull is unremarkable.  CT CERVICAL SPINE FINDINGS  The cervical spine shows normal alignment. Prior cervical fusion has been performed at C5-6 and C6-7 with normal alignment and appearance of fusion hardware. Both disc space levels show complete fusion. There is no evidence of acute fracture or subluxation. No soft tissue swelling or hematoma is identified. No bony or soft tissue lesions are seen. The visualized airway is normally patent.  IMPRESSION: 1. Stable atrophy and small vessel disease of the brain without acute findings. 2. Solid bony fusion at C5-6 and C6-7 after prior anterior cervical fusion. Fusion hardware appears normally positioned and without abnormal surrounding lucency. No acute cervical fracture is identified.   Electronically Signed   By: Irish Lack M.D.   On: 09/29/2013 16:14   Ct Cervical Spine Wo Contrast  09/29/2013   CLINICAL DATA:  Fall with neck injury.  EXAM: CT HEAD  WITHOUT CONTRAST  CT CERVICAL SPINE WITHOUT CONTRAST  TECHNIQUE: Multidetector CT imaging of the head and cervical spine was performed following the standard protocol without intravenous contrast. Multiplanar CT image reconstructions of the cervical spine were also generated.  COMPARISON:  06/14/2012  FINDINGS: CT HEAD FINDINGS  Stable atrophy and significant underlying small vessel disease. The brain demonstrates no evidence of hemorrhage, infarction, edema, mass effect, extra-axial fluid collection, hydrocephalus or mass lesion. The skull is unremarkable.  CT CERVICAL SPINE FINDINGS  The cervical spine shows normal alignment. Prior cervical fusion has been performed at C5-6 and C6-7 with normal alignment and appearance of fusion hardware. Both disc space levels show complete fusion. There is no evidence of acute fracture or subluxation. No soft tissue swelling or hematoma is identified. No bony or soft tissue lesions are seen. The visualized airway is normally patent.  IMPRESSION: 1. Stable atrophy and small vessel disease of the brain without acute findings. 2. Solid bony fusion at C5-6 and C6-7 after prior anterior cervical fusion. Fusion hardware appears normally positioned and without abnormal surrounding lucency. No acute cervical fracture is identified.   Electronically Signed   By: Irish Lack M.D.   On: 09/29/2013 16:14   Ct Hip Right Wo Contrast  09/29/2013   CLINICAL DATA:  Status post fall. Right hip pain. Negative hip radiographs.  EXAM: CT OF THE RIGHT HIP WITHOUT CONTRAST  TECHNIQUE: Multidetector CT imaging was performed according to the standard protocol. Multiplanar CT image reconstructions were also generated.  COMPARISON:  Current hip radiographs.  FINDINGS: No fracture. There are mild degenerative changes of the right hip with superior lateral hip joint space narrowing and bony prominence from the superior acetabulum with subchondral cystic change. The bones are demineralized. Minor  degenerative fat-sat mild degenerative changes are noted involving the right SI joint.  The surrounding musculature and tendinous insertions are unremarkable.  Visible structures within the pelvis are unremarkable.  IMPRESSION: 1. No fracture or dislocation.  No acute finding.   Electronically Signed   By: Amie Portland M.D.   On: 09/29/2013 18:46     EKG Interpretation None      MDM   Final diagnoses:  Contusion of right hip  Contusion of right shoulder  Right elbow pain  Fall   Pt presents from nursing home after an unwitnessed fall. Complaining  of neck pain, shoulder and elbow pain, Right hip and low back pain.  X-rays and CT head and C-spine negative for obvious fractures. 1730: Decreased range of motion to the right hip with flexion, internal and extraoral rotation. No obvious crepitus. CT of right hip to evaluate acute fracture.  Reeval patient resting comfortably in room. Patient's son left the ED. Discussed lab results, imaging results, and treatment plan with the patient. Return precautions given. Reports understanding and no other concerns at this time.  Patient is stable for discharge at this time.  Meds given in ED:  Medications  fentaNYL (SUBLIMAZE) injection 12.5 mcg (12.5 mcg Intravenous Given 09/29/13 1812)    Discharge Medication List as of 09/29/2013  7:14 PM          Leotis Shames Doretha Imus, PA-C 09/30/13 0153

## 2013-09-29 NOTE — ED Notes (Signed)
[facility] called for transport.  

## 2013-09-29 NOTE — ED Provider Notes (Signed)
Pt from a NH, usually nonambulatory, uses a wheelchair. Had a unwitnessed fall today.   Pt has dried, lips, and tongue. Has tenderness in her lateral right clavicle, right shoulder, right hip without shortening or ext/int rotation in either lower leg. Pt has C collar in place.   Medical screening examination/treatment/procedure(s) were conducted as a shared visit with non-physician practitioner(s) and myself.  I personally evaluated the patient during the encounter.   EKG Interpretation None       Devoria Albe, MD, Armando Gang   Ward Givens, MD 09/29/13 [phone]

## 2013-09-29 NOTE — Discharge Instructions (Signed)
Call for a follow up appointment with a Family or Primary Care Provider.  Return if symptoms worsen.   Take medication as prescribed.  Ice your shoulder and hip as needed 3 times a day.

## 2013-09-29 NOTE — ED Notes (Signed)
Pt presented from [facility], report of un witnessed fall, pt found on the floor laying on her right side, suspected injury include neck, shoulder and lumbar parts which were TTP. No LOC, obvious deformity on pt's head on assessment.

## 2013-09-29 NOTE — Progress Notes (Signed)
CSW met with patient and son at bedside to complete this assessment.  Patient is resting but willing to participate with the conversations.  Patient presents as oriented x2, alert, and cooperative.  Patient was able to confirm she will ask for more assistance at the [facility] when ambulating to the restrooms.  Patient and son agrees that she will be returning to the [facility] once she is medically cleared.      Chesley Noon, MSW, LCSWA,09/29/2013 Evening Clinical Social Worker [phone]

## 2013-09-29 NOTE — ED Notes (Signed)
Bed: [facility] Expected date:  Expected time:  Means of arrival:  Comments: ems fall

## 2013-10-03 NOTE — ED Provider Notes (Signed)
See prior note   Ward Givens, MD 10/03/13 1309

## 2013-11-28 ENCOUNTER — Other Ambulatory Visit: Payer: Self-pay | Admitting: *Deleted

## 2013-11-28 MED ORDER — DIAZEPAM 5 MG PO TABS: ORAL_TABLET | ORAL | Status: DC

## 2013-11-28 NOTE — Telephone Encounter (Signed)
[facility] 

## 2013-12-26 ENCOUNTER — Emergency Department (HOSPITAL_COMMUNITY): Payer: PRIVATE HEALTH INSURANCE

## 2013-12-26 ENCOUNTER — Emergency Department (HOSPITAL_COMMUNITY)
Admission: EM | Admit: 2013-12-26 | Discharge: 2013-12-28 | Disposition: A | Discharge status: Other Acute Inpatient Hospital | Payer: PRIVATE HEALTH INSURANCE | Attending: Emergency Medicine | Admitting: Emergency Medicine

## 2013-12-26 DIAGNOSIS — F3289 Other specified depressive episodes: Secondary | ICD-10-CM | POA: Insufficient documentation

## 2013-12-26 DIAGNOSIS — Z862 Personal history of diseases of the blood and blood-forming organs and certain disorders involving the immune mechanism: Secondary | ICD-10-CM | POA: Diagnosis not present

## 2013-12-26 DIAGNOSIS — Z794 Long term (current) use of insulin: Secondary | ICD-10-CM | POA: Diagnosis not present

## 2013-12-26 DIAGNOSIS — F02818 Dementia in other diseases classified elsewhere, unspecified severity, with other behavioral disturbance: Secondary | ICD-10-CM | POA: Insufficient documentation

## 2013-12-26 DIAGNOSIS — R4689 Other symptoms and signs involving appearance and behavior: Secondary | ICD-10-CM

## 2013-12-26 DIAGNOSIS — I10 Essential (primary) hypertension: Secondary | ICD-10-CM | POA: Diagnosis not present

## 2013-12-26 DIAGNOSIS — Z8739 Personal history of other diseases of the musculoskeletal system and connective tissue: Secondary | ICD-10-CM | POA: Insufficient documentation

## 2013-12-26 DIAGNOSIS — Z008 Encounter for other general examination: Secondary | ICD-10-CM | POA: Diagnosis present

## 2013-12-26 DIAGNOSIS — F0391 Unspecified dementia with behavioral disturbance: Secondary | ICD-10-CM

## 2013-12-26 DIAGNOSIS — Z7982 Long term (current) use of aspirin: Secondary | ICD-10-CM | POA: Insufficient documentation

## 2013-12-26 DIAGNOSIS — I2510 Atherosclerotic heart disease of native coronary artery without angina pectoris: Secondary | ICD-10-CM | POA: Insufficient documentation

## 2013-12-26 DIAGNOSIS — F919 Conduct disorder, unspecified: Secondary | ICD-10-CM | POA: Insufficient documentation

## 2013-12-26 DIAGNOSIS — F0151 Vascular dementia with behavioral disturbance: Secondary | ICD-10-CM | POA: Diagnosis present

## 2013-12-26 DIAGNOSIS — E669 Obesity, unspecified: Secondary | ICD-10-CM | POA: Diagnosis not present

## 2013-12-26 DIAGNOSIS — E1149 Type 2 diabetes mellitus with other diabetic neurological complication: Secondary | ICD-10-CM | POA: Insufficient documentation

## 2013-12-26 DIAGNOSIS — R451 Restlessness and agitation: Secondary | ICD-10-CM

## 2013-12-26 DIAGNOSIS — F0281 Dementia in other diseases classified elsewhere with behavioral disturbance: Secondary | ICD-10-CM | POA: Insufficient documentation

## 2013-12-26 DIAGNOSIS — Z79899 Other long term (current) drug therapy: Secondary | ICD-10-CM | POA: Insufficient documentation

## 2013-12-26 DIAGNOSIS — F329 Major depressive disorder, single episode, unspecified: Secondary | ICD-10-CM | POA: Diagnosis not present

## 2013-12-26 DIAGNOSIS — F01518 Vascular dementia, unspecified severity, with other behavioral disturbance: Secondary | ICD-10-CM | POA: Diagnosis present

## 2013-12-26 LAB — URINALYSIS, ROUTINE W REFLEX MICROSCOPIC
Bilirubin Urine: NEGATIVE
Glucose, UA: NEGATIVE mg/dL
Hgb urine dipstick: NEGATIVE
Ketones, ur: 15 mg/dL — AB
NITRITE: NEGATIVE
Protein, ur: 30 mg/dL — AB
SPECIFIC GRAVITY, URINE: 1.018 (ref 1.005–1.030)
Urobilinogen, UA: 0.2 mg/dL (ref 0.0–1.0)
pH: 7 (ref 5.0–8.0)

## 2013-12-26 LAB — CBC WITH DIFFERENTIAL/PLATELET
BASOS ABS: 0 10*3/uL (ref 0.0–0.1)
BASOS PCT: 0 % (ref 0–1)
Eosinophils Absolute: 0.1 10*3/uL (ref 0.0–0.7)
Eosinophils Relative: 0 % (ref 0–5)
HEMATOCRIT: 38.9 % (ref 36.0–46.0)
Hemoglobin: 12.5 g/dL (ref 12.0–15.0)
Lymphocytes Relative: 31 % (ref 12–46)
Lymphs Abs: 4.2 10*3/uL — ABNORMAL HIGH (ref 0.7–4.0)
MCH: 30.5 pg (ref 26.0–34.0)
MCHC: 32.1 g/dL (ref 30.0–36.0)
MCV: 94.9 fL (ref 78.0–100.0)
MONO ABS: 1.1 10*3/uL — AB (ref 0.1–1.0)
Monocytes Relative: 8 % (ref 3–12)
NEUTROS PCT: 61 % (ref 43–77)
Neutro Abs: 8 10*3/uL — ABNORMAL HIGH (ref 1.7–7.7)
Platelets: 138 10*3/uL — ABNORMAL LOW (ref 150–400)
RBC: 4.1 MIL/uL (ref 3.87–5.11)
RDW: 13.3 % (ref 11.5–15.5)
WBC: 13.4 10*3/uL — ABNORMAL HIGH (ref 4.0–10.5)

## 2013-12-26 LAB — COMPREHENSIVE METABOLIC PANEL
ALBUMIN: 3.8 g/dL (ref 3.5–5.2)
ALT: 22 U/L (ref 0–35)
AST: 42 U/L — AB (ref 0–37)
Alkaline Phosphatase: 83 U/L (ref 39–117)
Anion gap: 18 — ABNORMAL HIGH (ref 5–15)
BILIRUBIN TOTAL: 0.4 mg/dL (ref 0.3–1.2)
BUN: 24 mg/dL — ABNORMAL HIGH (ref 6–23)
CHLORIDE: 102 meq/L (ref 96–112)
CO2: 23 mEq/L (ref 19–32)
CREATININE: 0.92 mg/dL (ref 0.50–1.10)
Calcium: 9.4 mg/dL (ref 8.4–10.5)
GFR calc Af Amer: 64 mL/min — ABNORMAL LOW (ref >90)
GFR calc non Af Amer: 55 mL/min — ABNORMAL LOW (ref >90)
Glucose, Bld: 166 mg/dL — ABNORMAL HIGH (ref 70–99)
Potassium: 4.1 mEq/L (ref 3.7–5.3)
Sodium: 143 mEq/L (ref 137–147)
Total Protein: 7.5 g/dL (ref 6.0–8.3)

## 2013-12-26 LAB — I-STAT TROPONIN, ED: TROPONIN I, POC: 0.01 ng/mL (ref 0.00–0.08)

## 2013-12-26 LAB — URINE MICROSCOPIC-ADD ON

## 2013-12-26 MED ORDER — ZIPRASIDONE MESYLATE 20 MG IM SOLR
5.0000 mg | Freq: Once | INTRAMUSCULAR | Status: DC
  Filled 2013-12-26: qty 20

## 2013-12-26 MED ORDER — LORAZEPAM 2 MG/ML IJ SOLN
1.0000 mg | Freq: Once | INTRAMUSCULAR | Status: AC
  Administered 2013-12-26: 1 mg via INTRAMUSCULAR
  Filled 2013-12-26: qty 1

## 2013-12-26 MED ORDER — ZIPRASIDONE MESYLATE 20 MG IM SOLR
10.0000 mg | Freq: Once | INTRAMUSCULAR | Status: AC
  Administered 2013-12-26: 10 mg via INTRAMUSCULAR

## 2013-12-26 NOTE — ED Notes (Signed)
Pt to ed by EMS. pt from nursing home having a psychotic episode. HX of dementia. Per facility staff patient became hostile and aggressive . Pt received Haldol at 1815. Pt alert. Pt appears confused and hostile. Per facility staff pt fell this morning, hit her head. Pt is not on anticoagulant according to EMS and facility staff.

## 2013-12-26 NOTE — ED Notes (Signed)
Bed: ZO10 Expected date:  Expected time:  Means of arrival:  Comments: EMS/elderly psychosis

## 2013-12-26 NOTE — ED Provider Notes (Addendum)
TIME SEEN: 8:30 PM  CHIEF COMPLAINT: Agitation  HPI: Patient is a 80 y.o. F with history of coronary artery disease, insulin-dependent diabetes, hypertension, hyperlipidemia, polymyalgia rheumatica, vascular dementia who lives at [facility] [phone]) who presents to the emergency department with agitation and aggression. Discussed with patient's son Theodis Aguas who reports this has been going on for the past 3-4 weeks progressively worsening. Patient was aggressive toward staff, yelling obscenities and racial slurs and hitting them today. She was given multiple rounds of IM benzodiazepines and Haldol without relief. She was sent to the emergency department for further evaluation.  ROS: Level V caveat for dementia  PAST MEDICAL HISTORY/PAST SURGICAL HISTORY:  Past Medical History  Diagnosis Date  . Coronary atherosclerosis of unspecified type of vessel, native or graft   . Depressive disorder, not elsewhere classified   . Type II or unspecified type diabetes mellitus with neurological manifestations, not stated as uncontrolled   . Other and unspecified hyperlipidemia   . Unspecified essential hypertension   . Occlusion and stenosis of carotid artery without mention of cerebral infarction   . Personal history of other diseases of circulatory system   . Unspecified cerebral artery occlusion with cerebral infarction   . Polymyalgia rheumatica   . Obesity, unspecified   . Leukocytosis, unspecified     MEDICATIONS:  Prior to Admission medications   Medication Sig Start Date End Date Taking? Authorizing Provider  acetaminophen (TYLENOL) 325 MG tablet Take 650 mg by mouth every 4 (four) hours as needed for moderate pain or fever.     Historical Provider, MD  aluminum & magnesium hydroxide-simethicone (MYLANTA) 500-450-40 MG/5ML suspension Take 30 mLs by mouth every 4 (four) hours as needed for indigestion.     Historical Provider, MD  amLODipine (NORVASC) 10 MG tablet Take 10  mg by mouth every morning.     Historical Provider, MD  aspirin 81 MG chewable tablet Chew 81 mg by mouth every morning.    Historical Provider, MD  atenolol (TENORMIN) 25 MG tablet Take 25 mg by mouth every morning.     Historical Provider, MD  bisacodyl (DULCOLAX) 10 MG suppository Place 10 mg rectally as needed for moderate constipation.    Historical Provider, MD  cetirizine (ZYRTEC) 10 MG tablet Take 10 mg by mouth every morning.     Historical Provider, MD  chlorhexidine (PERIDEX) 0.12 % solution Swish and spit 15 mLs at bedtime.     Historical Provider, MD  Cranberry-Vitamin C-Inulin (UTI-STAT) LIQD Take 30 mLs by mouth every morning.     Historical Provider, MD  diazepam (VALIUM) 5 MG tablet Take 1/2 tablet by mouth three times daily (am,4pm and evening) 11/28/13   Arlo C Lassen, PA-C  DULoxetine (CYMBALTA) 30 MG capsule Take 30 mg by mouth every morning.    Historical Provider, MD  ergocalciferol (VITAMIN D2) 50000 UNITS capsule Take 50,000 Units by mouth every 30 (thirty) days. Take on the 15th every month.    Historical Provider, MD  ferrous sulfate 325 (65 FE) MG tablet Take 325 mg by mouth daily with breakfast.      Historical Provider, MD  gabapentin (NEURONTIN) 100 MG capsule Take 100 mg by mouth 2 (two) times daily.     Historical Provider, MD  guaiFENesin (ROBITUSSIN) 100 MG/5ML SOLN Take 200 mg by mouth every 4 (four) hours as needed for cough or to loosen phlegm.    Historical Provider, MD  insulin aspart (NOVOLOG) 100 UNIT/ML injection Inject 0-15 Units into the  skin as directed. Give 15 units at 0800 and 1200 (hold if CBG <130). Give 13 units at 1700 (hold if CBG <150). Use sliding scale for bedtime dose at 2000 (0-150 = 0 units, 151-200 = 1 unit, 201-250 = 2 units, 251-300 = 4 units, 301-350 = 6 units, 351-400 = 8 units, 401-450 = 10 units, >450 = 12 units).    Historical Provider, MD  insulin glargine (LANTUS) 100 UNIT/ML injection Inject 82 Units into the skin at bedtime.      Historical Provider, MD  ipratropium-albuterol (DUONEB) 0.5-2.5 (3) MG/3ML SOLN Take 3 mLs by nebulization every 6 (six) hours as needed (shortness of breath).     Historical Provider, MD  isosorbide mononitrate (IMDUR) 30 MG 24 hr tablet Take 30 mg by mouth every evening.     Historical Provider, MD  magnesium hydroxide (MILK OF MAGNESIA) 400 MG/5ML suspension Take 30 mLs by mouth daily as needed for mild constipation.    Historical Provider, MD  nitroGLYCERIN (NITROSTAT) 0.4 MG SL tablet Place 0.4 mg under the tongue every 5 (five) minutes as needed for chest pain.    Historical Provider, MD  omeprazole (PRILOSEC) 20 MG capsule Take 20 mg by mouth every morning.    Historical Provider, MD  pravastatin (PRAVACHOL) 10 MG tablet Take 5 mg by mouth every evening.    Historical Provider, MD  promethazine (PHENERGAN) 25 MG suppository Place 25 mg rectally every 6 (six) hours as needed for nausea or vomiting.    Historical Provider, MD  promethazine (PHENERGAN) 25 MG tablet Take 25 mg by mouth every 6 (six) hours as needed for nausea or vomiting.    Historical Provider, MD  promethazine (PHENERGAN) 25 MG/ML injection Inject 25 mg into the muscle every 6 (six) hours as needed for nausea or vomiting.    Historical Provider, MD  QUEtiapine (SEROQUEL) 50 MG tablet Take 75 mg by mouth 2 (two) times daily.    Historical Provider, MD  senna (SENOKOT) 8.6 MG TABS Take 2 tablets by mouth at bedtime.     Historical Provider, MD  sitaGLIPtin (JANUVIA) 100 MG tablet Take 100 mg by mouth every morning.     Historical Provider, MD  sodium phosphate (FLEET) enema Place 1 enema rectally daily as needed (constipation).     Historical Provider, MD  traMADol (ULTRAM) 50 MG tablet Take 50-100 mg by mouth every 6 (six) hours as needed for moderate pain or severe pain.     Historical Provider, MD    ALLERGIES:  Allergies  Allergen Reactions  . Codeine Other (See Comments)    Per MAR  . Hydrocodone Other (See Comments)     Per MAR  . Ibuprofen Other (See Comments)    Per MAR  . Penicillins Other (See Comments)    Per MAR  . Sulfonamide Derivatives Other (See Comments)    Per MAR    SOCIAL HISTORY:  History  Substance Use Topics  . Smoking status: Never Smoker   . Smokeless tobacco: Not on file  . Alcohol Use: No    FAMILY HISTORY: Family History  Problem Relation Age of Onset  . Coronary artery disease Other     EXAM: BP 169/65  Pulse 86  Temp(Src) 98.6 F (37 C) (Oral)  Resp 18  SpO2 94% CONSTITUTIONAL: Alert and will monitor questions or follow commands, she is aggressive, try to hit and kick staff, cursing HEAD: Normocephalic, atraumatic EYES: Conjunctivae clear, PERRL ENT: normal nose; no rhinorrhea; moist mucous membranes; pharynx  without lesions noted, no dental injury or septal hematoma NECK: Supple, no meningismus, no LAD  CARD: RRR; S1 and S2 appreciated; no murmurs, no clicks, no rubs, no gallops, no midline spinal tenderness or step-off or deformity RESP: Normal chest excursion without splinting or tachypnea; breath sounds clear and equal bilaterally; no wheezes, no rhonchi, no rales, no respiratory distress or hypoxia ABD/GI: Normal bowel sounds; non-distended; soft, non-tender, no rebound, no guarding BACK:  The back appears normal and is non-tender to palpation, there is no CVA tenderness, no midline spinal tenderness or step-off or deformity EXT: Normal ROM in all joints; non-tender to palpation; no edema; normal capillary refill; no cyanosis    SKIN: Normal color for age and race; warm, small skin tears to bilateral dorsal hands NEURO: Moves all extremities equally, no slurred speech or obvious facial droop PSYCH: Patient is very aggressive, unable to be redirected, trying to get out of bed, trying to hit and kick staff, cussing  MEDICAL DECISION MAKING: Patient here with agitation, aggression. Suspect this is likely due to progressive worsening of her vascular dementia. Son  Theodis Aguas [phone]) states his been going on for the past 4 weeks and progressively worsening. He is not sure of any new medications. In order to keep patient safe we have had to restrain her and give her IM Geodon and Ativan.  She is hitting and kicking staff.  States "let me go or you'll be sorry", "you are a bitch", "your mother was an idiot" and other profane statements, racial slurs.  ED PROGRESS: Patient's labs a leukocytosis with left shift which may be reactive. Chest x-ray shows no infiltrate. Urine shows trace leukocytes without other sign of infection. CT of her head and cervical spine show no acute injury or other acute abnormality. I think her symptoms are secondary to her slowly progressing vascular dementia.  Discussed with nursing home staff. Ears practitioner at nursing home so the patient could not continue to stay at the nursing home at this time. Per nursing home tech, NP wanted pt admitted to [facility] for geri-psych evaluation.  Will consult TTS.  Pt medically stable, sleeping but arousable and cooperative.  Byrdie Miyazaki - son - [phone]  Anderson Malta - daughter - [phone]     EKG Interpretation  Date/Time:  Friday December 26 2013 22:15:39 EDT Ventricular Rate:  71 PR Interval:  168 QRS Duration: 125 QT Interval:  456 QTC Calculation: 496 R Axis:   -79 Text Interpretation:  Sinus rhythm Nonspecific IVCD with LAD LVH with secondary repolarization abnormality Anterior Q waves, possibly due to LVH Baseline wander in lead(s) V3 No significant change since last tracing Confirmed by WARD,  DO, KRISTEN [phone]) on 12/26/2013 10:27:10 PM        Layla Maw Ward, DO 12/26/13 2323  Layla Maw Ward, DO 12/26/13 2332

## 2013-12-26 NOTE — ED Notes (Signed)
Pt awoke, still appears agitated. Will continue upper extremity restraints.

## 2013-12-27 ENCOUNTER — Encounter (HOSPITAL_COMMUNITY): Payer: Self-pay | Admitting: Psychiatry

## 2013-12-27 DIAGNOSIS — F919 Conduct disorder, unspecified: Secondary | ICD-10-CM | POA: Diagnosis not present

## 2013-12-27 DIAGNOSIS — F03918 Unspecified dementia, unspecified severity, with other behavioral disturbance: Secondary | ICD-10-CM

## 2013-12-27 DIAGNOSIS — F01518 Vascular dementia, unspecified severity, with other behavioral disturbance: Secondary | ICD-10-CM | POA: Diagnosis present

## 2013-12-27 DIAGNOSIS — F0391 Unspecified dementia with behavioral disturbance: Secondary | ICD-10-CM

## 2013-12-27 DIAGNOSIS — R4689 Other symptoms and signs involving appearance and behavior: Secondary | ICD-10-CM | POA: Diagnosis present

## 2013-12-27 DIAGNOSIS — F0151 Vascular dementia with behavioral disturbance: Secondary | ICD-10-CM | POA: Diagnosis present

## 2013-12-27 LAB — CBG MONITORING, ED
GLUCOSE-CAPILLARY: 139 mg/dL — AB (ref 70–99)
GLUCOSE-CAPILLARY: 144 mg/dL — AB (ref 70–99)
GLUCOSE-CAPILLARY: 188 mg/dL — AB (ref 70–99)
Glucose-Capillary: 147 mg/dL — ABNORMAL HIGH (ref 70–99)

## 2013-12-27 LAB — VALPROIC ACID LEVEL: VALPROIC ACID LVL: 19.3 ug/mL — AB (ref 50.0–100.0)

## 2013-12-27 MED ORDER — MEMANTINE HCL 10 MG PO TABS
10.0000 mg | ORAL_TABLET | Freq: Every day | ORAL | Status: DC
  Administered 2013-12-27 – 2013-12-28 (×2): 10 mg via ORAL
  Filled 2013-12-27 (×2): qty 1

## 2013-12-27 MED ORDER — INSULIN ASPART 100 UNIT/ML ~~LOC~~ SOLN: 15.0000 [IU] | Freq: Two times a day (BID) | SUBCUTANEOUS | Status: DC

## 2013-12-27 MED ORDER — DONEPEZIL HCL 5 MG PO TABS
5.0000 mg | ORAL_TABLET | Freq: Every day | ORAL | Status: DC
  Administered 2013-12-27: 5 mg via ORAL
  Filled 2013-12-27 (×3): qty 1

## 2013-12-27 MED ORDER — INSULIN GLARGINE 100 UNIT/ML ~~LOC~~ SOLN: 15.0000 [IU] | Freq: Two times a day (BID) | SUBCUTANEOUS | Status: DC

## 2013-12-27 MED ORDER — INSULIN ASPART PROT & ASPART (70-30 MIX) 100 UNIT/ML ~~LOC~~ SUSP: 13.0000 [IU] | Freq: Once | SUBCUTANEOUS | Status: DC

## 2013-12-27 MED ORDER — ATENOLOL 25 MG PO TABS
25.0000 mg | ORAL_TABLET | Freq: Every day | ORAL | Status: DC
  Administered 2013-12-27 – 2013-12-28 (×2): 25 mg via ORAL
  Filled 2013-12-27 (×2): qty 1

## 2013-12-27 MED ORDER — ZIPRASIDONE MESYLATE 20 MG IM SOLR: 10.0000 mg | Freq: Once | INTRAMUSCULAR | Status: DC

## 2013-12-27 MED ORDER — INSULIN GLARGINE 100 UNIT/ML ~~LOC~~ SOLN
40.0000 [IU] | Freq: Every day | SUBCUTANEOUS | Status: DC
  Administered 2013-12-28: 40 [IU] via SUBCUTANEOUS
  Filled 2013-12-27 (×2): qty 0.4

## 2013-12-27 MED ORDER — GABAPENTIN 100 MG PO CAPS
100.0000 mg | ORAL_CAPSULE | Freq: Two times a day (BID) | ORAL | Status: DC
  Administered 2013-12-27 (×2): 100 mg via ORAL
  Filled 2013-12-27 (×3): qty 1

## 2013-12-27 MED ORDER — INSULIN ASPART 100 UNIT/ML ~~LOC~~ SOLN: 13.0000 [IU] | Freq: Every day | SUBCUTANEOUS | Status: DC

## 2013-12-27 MED ORDER — DIVALPROEX SODIUM 125 MG PO CPSP
125.0000 mg | ORAL_CAPSULE | Freq: Three times a day (TID) | ORAL | Status: DC
  Administered 2013-12-27 – 2013-12-28 (×3): 125 mg via ORAL
  Filled 2013-12-27 (×7): qty 1

## 2013-12-27 MED ORDER — ISOSORBIDE MONONITRATE ER 30 MG PO TB24
30.0000 mg | ORAL_TABLET | Freq: Every day | ORAL | Status: DC
  Administered 2013-12-27: 30 mg via ORAL
  Filled 2013-12-27 (×3): qty 1

## 2013-12-27 MED ORDER — QUETIAPINE FUMARATE 100 MG PO TABS
100.0000 mg | ORAL_TABLET | Freq: Two times a day (BID) | ORAL | Status: DC
  Administered 2013-12-27 – 2013-12-28 (×3): 100 mg via ORAL
  Filled 2013-12-27 (×3): qty 1

## 2013-12-27 MED ORDER — LINAGLIPTIN 5 MG PO TABS
5.0000 mg | ORAL_TABLET | Freq: Every day | ORAL | Status: DC
  Administered 2013-12-27 – 2013-12-28 (×2): 5 mg via ORAL
  Filled 2013-12-27 (×2): qty 1

## 2013-12-27 MED ORDER — AMLODIPINE BESYLATE 10 MG PO TABS
10.0000 mg | ORAL_TABLET | Freq: Every day | ORAL | Status: DC
  Administered 2013-12-27 – 2013-12-28 (×2): 10 mg via ORAL
  Filled 2013-12-27 (×2): qty 1

## 2013-12-27 MED ORDER — DIAZEPAM 2 MG PO TABS
2.0000 mg | ORAL_TABLET | Freq: Two times a day (BID) | ORAL | Status: DC
  Administered 2013-12-27 – 2013-12-28 (×2): 2 mg via ORAL
  Filled 2013-12-27 (×2): qty 1

## 2013-12-27 MED ORDER — INSULIN GLARGINE 100 UNIT/ML ~~LOC~~ SOLN: 13.0000 [IU] | Freq: Every day | SUBCUTANEOUS | Status: DC

## 2013-12-27 MED ORDER — INSULIN ASPART PROT & ASPART (70-30 MIX) 100 UNIT/ML ~~LOC~~ SUSP: 15.0000 [IU] | Freq: Two times a day (BID) | SUBCUTANEOUS | Status: DC

## 2013-12-27 MED ORDER — ASPIRIN 81 MG PO CHEW
81.0000 mg | CHEWABLE_TABLET | Freq: Every day | ORAL | Status: DC
  Administered 2013-12-27 – 2013-12-28 (×2): 81 mg via ORAL
  Filled 2013-12-27 (×2): qty 1

## 2013-12-27 MED ORDER — INSULIN ASPART 100 UNIT/ML ~~LOC~~ SOLN: 0.0000 [IU] | Freq: Every day | SUBCUTANEOUS | Status: DC

## 2013-12-27 MED ORDER — INSULIN GLARGINE 100 UNIT/ML ~~LOC~~ SOLN
40.0000 [IU] | Freq: Once | SUBCUTANEOUS | Status: AC
  Administered 2013-12-27: 40 [IU] via SUBCUTANEOUS
  Filled 2013-12-27: qty 0.4

## 2013-12-27 MED ORDER — DULOXETINE HCL 30 MG PO CPEP
30.0000 mg | ORAL_CAPSULE | Freq: Every day | ORAL | Status: DC
  Administered 2013-12-27: 30 mg via ORAL
  Filled 2013-12-27 (×2): qty 1

## 2013-12-27 NOTE — ED Notes (Signed)
Upon entering room post change of shift report pt in nonviolent restraints. Pt appears calm and verbally requests "get me out of these things." Pt restraints removed. Pt brief changed, warm blanket given, and pt repositioned. Pt denies pain at present time. Upon assessment, pt moderate pulses to all extremities and has generalized bruising to all extremities. Pt alert and oriented to self. Pt hx of dementia. Bed alarm placed on pt bed for safety. MD Nanavati aware of all.

## 2013-12-27 NOTE — ED Notes (Addendum)
Daughter at bedside. MD made aware.   [name]/daughter [phone].

## 2013-12-27 NOTE — ED Notes (Signed)
Bed alarm sounded. Pt attempting to get out of bed. Pt reoriented and repositioned for comfort. Pt appears agitated and refuses medication at present time. Per Electa Sniff will attempt medication administration shortly.

## 2013-12-27 NOTE — ED Provider Notes (Signed)
Assuming care of patient this AM, Patient in the ED for agitation and psychoses. Workup thus far is showing mild leukocytosis. Pt is oriented to self only right now. She was in non violent restraints, which we have removed, vascular checks completed. Diffuse ecchymoses, upper and lower extremities. Patient had no complains, no concerns from the nursing side. Will continue to monitor.  Filed Vitals:   12/27/13 0739  BP: 150/68  Pulse: 78  Temp: 98.6 F (37 C)  Resp: 18    Depakote level added.   Derwood Kaplan, MD 12/27/13 [phone]

## 2013-12-27 NOTE — ED Notes (Signed)
TTS at bedside. 

## 2013-12-27 NOTE — Progress Notes (Signed)
The following gero-psych facilities have been contacted to initiate inpt placment on pt's behalf:  REFERRAL FAXED: [place]- Per Garce beds available [facility]- per Phyllis beds available [facility]- per Beth possible bed available  AT [facility]: [facility]- per Kim [facility] [place]- per JoAnn   [name] [name] Disposition MHT   

## 2013-12-27 NOTE — BH Assessment (Signed)
Received call from [facility]. They are consider Pt for transfer and request and EKG. Notified Jari Favre, RN at [facility].  Harlin Rain Ria Comment, [facility] Triage Specialist [phone]

## 2013-12-27 NOTE — ED Notes (Signed)
Per MD Freida Busman assure pt eats full meal. Recheck CBG. If stable give 13 units as ordered.

## 2013-12-27 NOTE — ED Notes (Signed)
Per Electa Sniff attempted to give pt medications. Pt refused majority of medications. Will attempt medication administration shortly to prevent pt aggitation. Pt repositioned for comfort. Pt resting at present time.

## 2013-12-27 NOTE — ED Notes (Signed)
Pt agrees to take medication. Pt does not get agitated if she sets pace of medication administration. Pt takes one medication at a time and request when she is ready for another one.

## 2013-12-27 NOTE — ED Notes (Signed)
Annieah at bedside assisting feed pt.

## 2013-12-27 NOTE — ED Notes (Signed)
Per son Theodis Aguas call and update post TTS assessment at [phone].

## 2013-12-27 NOTE — ED Notes (Addendum)
[name] [name] attempting blood draw at present time.
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
## 2013-12-27 NOTE — ED Notes (Signed)
Pt bed alarm alarming. Pt sitting up in bed. Pt assisted with 2 staff members to nearby restroom to void per request. Pt calm and cooperative. Pt assisted back in bed and repositioned for comfort.

## 2013-12-27 NOTE — ED Notes (Signed)
[name] [name] NT reports only able to get pt to eat a few more bites of food. Pt reports does not want anymore. MD Freida Busman notified.

## 2013-12-27 NOTE — ED Notes (Signed)
Jeannine diabetic coordinator contacted and reports safe to give lantus with current CBG of 139.

## 2013-12-27 NOTE — ED Notes (Signed)
Pt assisted to eat. Pt has eaten 5 large bites of stuffing and requests to take a break and drink some lemonade before eating more food. NT verbalizes will continue to assist when she is ready to begin eating again.

## 2013-12-27 NOTE — ED Notes (Signed)
Recheck O2 saturation related to previous reading. Pt 94% on RA. Pt calm at present time.

## 2013-12-27 NOTE — ED Notes (Signed)
Pt resting comfortably with lights dimmed. Bed alarm in place.
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
## 2013-12-27 NOTE — ED Notes (Signed)
Bed: WA17 Expected date:  Expected time:  Means of arrival:  Comments: 

## 2013-12-28 ENCOUNTER — Encounter (HOSPITAL_COMMUNITY): Payer: Self-pay | Admitting: Registered Nurse

## 2013-12-28 DIAGNOSIS — F603 Borderline personality disorder: Secondary | ICD-10-CM

## 2013-12-28 DIAGNOSIS — F919 Conduct disorder, unspecified: Secondary | ICD-10-CM | POA: Diagnosis not present

## 2013-12-28 LAB — CBG MONITORING, ED
GLUCOSE-CAPILLARY: 116 mg/dL — AB (ref 70–99)
Glucose-Capillary: 164 mg/dL — ABNORMAL HIGH (ref 70–99)

## 2013-12-28 NOTE — ED Notes (Addendum)
Pt resting. Lights dimmed.

## 2013-12-28 NOTE — ED Notes (Signed)
Son that has the POA is at the bedside.

## 2013-12-28 NOTE — ED Notes (Signed)
Spoke to patient daughter Harriett Sine) and Son Theodis Aguas). Theodis Aguas states that he has power of attorney over his mother. He states that he has more questions for physician and staff before agreeing to a transfer. He states he will be in to speak with care team in the morning (12/28/13) before lunch. MD updated.

## 2013-12-28 NOTE — ED Notes (Signed)
Drank remaining 50% of OJ

## 2013-12-28 NOTE — ED Notes (Signed)
Offered to wash patients face and she states " no, just leave me alone please." Will continue to monitor patient. Patient allowed to rest.

## 2013-12-28 NOTE — Discharge Instructions (Signed)
Dementia °Dementia is a word that is used to describe problems with the brain and how it works. People with dementia have memory loss. They may also have problems with thinking, speaking, or solving problems. It can affect how they act around people, how they do their job, their mood, and their personality. These changes may not show up for a long time. Family or friends may not notice problems in the early part of this disease. °HOME CARE °The following tips are for the person living with, or caring for, the person with dementia. °Make the home safe. °· Remove locks on bathroom doors. °· Use childproof locks on cabinets where alcohol, cleaning supplies, or chemicals are stored. °· Put outlet covers in electrical outlets. °· Put in childproof locks to keep doors and windows safe. °· Remove stove knobs, or put in safety knobs that shut off on their own. °· Lower the temperature on water heaters. °· Label medicines. Lock them in a safe place. °· Keep knives, lighters, matches, power tools, and guns out of reach or in a safe place. °· Remove objects that might break or can hurt the person. °· Make sure lighting is good inside and outside. °· Put in grab bars if needed. °· Use a device that detects falls or other needs for help. °Lessen confusion. °· Keep familiar objects and people around. °· Use night lights or low lit (dim) lights at night. °· Label objects or areas. °· Use reminders, notes, or directions for daily activities or tasks. °· Keep a simple routine that is the same for waking, meals, bathing, dressing, and bedtime. °· Create a calm and quiet home. °· Put up clocks and calendars. °· Keep emergency numbers and the home address near all phones. °· Help show the different times of day. Open the curtains during the day to let light in. °Speak clearly and directly. °· Choose simple words and short sentences. °· Use a gentle, calm voice. °· Do not interrupt. °· If the person has a hard time finding a word to  use, give them the word or thought. °· Ask 1 question at a time. Give enough time for the person to answer. Repeat the question if the person does not answer. °Do things that lessen restlessness. °· Provide a comfortable bed. °· Have the same bedtime routine every night. °· Have a regular walking and activity schedule. °· Lessen naps during the day. °· Do not let the person drink a lot of caffeine. °· Go to events that are not overwhelming. °Eat well and drink fluids. °· Lessen distractions during meal times and snacks. °· Avoid foods that are too hot or too cold. °· Watch how the person chews and swallows. This is to make sure they do not choke. °Other °· Keep all vision, hearing, dental, and medical visits with the doctor. °· Only give medicines as told by the doctor. °· Watch the person's driving ability. Do not let the person drive if he or she cannot drive safely. °· Use a program that helps find a person if they become missing. You may need to register with this program. °GET HELP RIGHT AWAY IF:  °· A fever of 102° F (38.9° C) develops. °· Confusion develops or gets worse. °· Sleepiness develops or gets worse. °· Staying awake is hard to do. °· New behavior problems start like mood swings, aggression, and seeing things that are not there. °· Problems with balance, speech, or falling develop. °· Problems swallowing develop. °· Any   problems of another sickness develop. °MAKE SURE YOU: °· Understand these instructions. °· Will watch his or her condition. °· Will get help right away if he or she is not doing well or gets worse. °Document Released: 03/30/2008 Document Revised: 07/10/2011 Document Reviewed: 09/12/2010 °[employer]® Patient Information ©2015 [employer]. This information is not intended to replace advice given to you by your health care provider. Make sure you discuss any questions you have with your health care provider. ° °
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
## 2013-12-28 NOTE — ED Notes (Addendum)
Pt sleeping upon shift assessment. Even and unlabored respirations. Rise and fall of chest. VSS. Heart rate 64 NSR.

## 2013-12-28 NOTE — ED Notes (Signed)
Psychiatry team at bedside speaking to son.

## 2013-12-28 NOTE — ED Notes (Addendum)
Spoke to patient's daughter and provided her information on the location of [place]. Daughter is going to see if the patient's son can come soon to signed papers for patient to be transported to [facility].

## 2013-12-28 NOTE — ED Notes (Signed)
[facility] called to transport patient to [facility].

## 2013-12-28 NOTE — BH Assessment (Signed)
Received call from [place] at [facility] who said if a [place] they need a copy of IVC paperwork to accept Pt. Notified Dr. Read Drivers of request.  Harlin Rain Patsy [place], [facility], [facility] Triage Specialist [phone]

## 2013-12-28 NOTE — Progress Notes (Signed)
CSW faxed consent form and poa to [facility], and [facility] [place] confirmed receipt. Pt has been accepted by Dr. Lowanda Foster. Report can be called to [facility] at [phone]. As per discussion with our charge nurse and medical team, pt will go by non-emergent ambulance for safe transport.  Mariann Laster,     ED CSW  phone: [phone] 11:46am

## 2013-12-28 NOTE — ED Notes (Signed)
PTAR at bedside 

## 2014-02-10 ENCOUNTER — Other Ambulatory Visit: Payer: Self-pay | Admitting: *Deleted

## 2014-02-10 MED ORDER — CLONAZEPAM 0.5 MG PO TABS: ORAL_TABLET | ORAL | Status: DC

## 2014-02-10 NOTE — Telephone Encounter (Signed)
[facility] 

## 2014-02-13 ENCOUNTER — Non-Acute Institutional Stay (SKILLED_NURSING_FACILITY): Payer: PRIVATE HEALTH INSURANCE | Admitting: Internal Medicine

## 2014-02-13 DIAGNOSIS — N183 Chronic kidney disease, stage 3 unspecified: Secondary | ICD-10-CM | POA: Insufficient documentation

## 2014-02-13 DIAGNOSIS — E1143 Type 2 diabetes mellitus with diabetic autonomic (poly)neuropathy: Secondary | ICD-10-CM

## 2014-02-13 DIAGNOSIS — E785 Hyperlipidemia, unspecified: Secondary | ICD-10-CM

## 2014-02-13 DIAGNOSIS — F03918 Unspecified dementia, unspecified severity, with other behavioral disturbance: Secondary | ICD-10-CM

## 2014-02-13 DIAGNOSIS — I10 Essential (primary) hypertension: Secondary | ICD-10-CM

## 2014-02-13 DIAGNOSIS — F0391 Unspecified dementia with behavioral disturbance: Secondary | ICD-10-CM

## 2014-02-13 DIAGNOSIS — I25119 Atherosclerotic heart disease of native coronary artery with unspecified angina pectoris: Secondary | ICD-10-CM

## 2014-02-13 NOTE — Assessment & Plan Note (Signed)
Pt on ASA, BBlocker, imdur, statin

## 2014-02-13 NOTE — Progress Notes (Signed)
MRN: [id number] Name: [name] [name]  Sex: female Age: 80 y.o. DOB: [birth year]  [facility] #: [facility] Facility/Room: 208D Level Of Care: SNF Provider: Merrilee Seashore D Emergency Contacts: Extended Emergency Contact Information Primary Emergency Contact: [name],[name] Address: [address]          [place], [place] [address] [name] Home Phone: [phone] Mobile Phone: [phone] Relation: Daughter Secondary Emergency Contact: [name] Address: [address], [place] [address] [name] of [place] Home Phone: [phone] Mobile Phone: [phone] Relation: Son  Code Status: DNR  Allergies: Codeine; Hydrocodone; Ibuprofen; Penicillins; and Sulfonamide derivatives  Chief Complaint  Patient presents with  . nursing home admission    HPI: Patient is 80 y.o. female who is admitted after a long psychiatric hospitalization for aggressive behavoirs.  Past Medical History  Diagnosis Date  . Coronary atherosclerosis of unspecified type of vessel, native or graft   . Depressive disorder, not elsewhere classified   . Type II or unspecified type diabetes mellitus with neurological manifestations, not stated as uncontrolled   . Other and unspecified hyperlipidemia   . Unspecified essential hypertension   . Occlusion and stenosis of carotid artery without mention of cerebral infarction   . Personal history of other diseases of circulatory system   . Unspecified cerebral artery occlusion with cerebral infarction   . Polymyalgia rheumatica   . Obesity, unspecified   . Leukocytosis, unspecified     Past Surgical History  Procedure Laterality Date  . Total abdominal hysterectomy    . Appendectomy    . Cataract surgery      bilateral w intraocular placement  . Refractive surgery      both eyes x6  . Posterior laminectomy / decompression cervical spine      w diskectomy and fusion of c5-c7 and anterior cervical plate  . Surgery of  kidney stones      a few years ago      Medication List       This list is accurate as of: 02/13/14 11:59 PM.  Always use your most recent med list.               amLODipine 10 MG tablet  Commonly known as:  NORVASC  Take 10 mg by mouth every morning.     aspirin 81 MG chewable tablet  Chew 81 mg by mouth every morning.     atenolol 25 MG tablet  Commonly known as:  TENORMIN  Take 25 mg by mouth every morning.     cetirizine 10 MG tablet  Commonly known as:  ZYRTEC  Take 10 mg by mouth every morning.     clonazePAM 0.5 MG tablet  Commonly known as:  KLONOPIN  Take one tablet by mouth twice daily     divalproex 500 MG DR tablet  Commonly known as:  DEPAKOTE  Take 500 mg by mouth 3 (three) times daily.     ferrous sulfate 325 (65 FE) MG tablet  Take 325 mg by mouth daily with breakfast.     gabapentin 100 MG capsule  Commonly known as:  NEURONTIN  Take 100 mg by mouth 2 (two) times daily.     insulin detemir 100 UNIT/ML injection  Commonly known as:  LEVEMIR  Inject 50 Units into the skin at bedtime.     ipratropium-albuterol 0.5-2.5 (3) MG/3ML Soln  Commonly known as:  DUONEB  Take 3 mLs by nebulization every 6 (  six) hours as needed (shortness of breath).     isosorbide mononitrate 30 MG 24 hr tablet  Commonly known as:  IMDUR  Take 30 mg by mouth every evening.     linagliptin 5 MG Tabs tablet  Commonly known as:  TRADJENTA  Take 5 mg by mouth daily.     lisinopril 5 MG tablet  Commonly known as:  PRINIVIL,ZESTRIL  Take 5 mg by mouth daily.     nitroGLYCERIN 0.4 MG SL tablet  Commonly known as:  NITROSTAT  Place 0.4 mg under the tongue every 5 (five) minutes as needed for chest pain.     omeprazole 20 MG capsule  Commonly known as:  PRILOSEC  Take 20 mg by mouth daily.     pravastatin 10 MG tablet  Commonly known as:  PRAVACHOL  Take 5 mg by mouth daily.     QUEtiapine 25 MG tablet  Commonly known as:  SEROQUEL  Take 25 mg by mouth 2 (two)  times daily.     saccharomyces boulardii 250 MG capsule  Commonly known as:  FLORASTOR  Take 250 mg by mouth 2 (two) times daily.     senna 8.6 MG Tabs tablet  Commonly known as:  SENOKOT  Take 2 tablets by mouth at bedtime.     sucralfate 1 G tablet  Commonly known as:  CARAFATE  Take 1 g by mouth every 6 (six) hours.        Meds ordered this encounter  Medications  . insulin detemir (LEVEMIR) 100 UNIT/ML injection    Sig: Inject 50 Units into the skin at bedtime.  Marland Kitchen. linagliptin (TRADJENTA) 5 MG TABS tablet    Sig: Take 5 mg by mouth daily.  Marland Kitchen. lisinopril (PRINIVIL,ZESTRIL) 5 MG tablet    Sig: Take 5 mg by mouth daily.  . sucralfate (CARAFATE) 1 G tablet    Sig: Take 1 g by mouth every 6 (six) hours.  . divalproex (DEPAKOTE) 500 MG DR tablet    Sig: Take 500 mg by mouth 3 (three) times daily.  . QUEtiapine (SEROQUEL) 25 MG tablet    Sig: Take 25 mg by mouth 2 (two) times daily.  . pravastatin (PRAVACHOL) 10 MG tablet    Sig: Take 5 mg by mouth daily.  Marland Kitchen. omeprazole (PRILOSEC) 20 MG capsule    Sig: Take 20 mg by mouth daily.    Immunization History  Administered Date(s) Administered  . Influenza Whole 01/30/2013    History  Substance Use Topics  . Smoking status: Never Smoker   . Smokeless tobacco: Not on file  . Alcohol Use: No    Family history is noncontributory    Review of Systems  DATA OBTAINED: from patient GENERAL:  no fevers, fatigue, appetite changes SKIN: No itching, rash or wounds EYES: No eye pain, redness, discharge EARS: No earache, tinnitus, change in hearing NOSE: No congestion, drainage or bleeding  MOUTH/THROAT: No mouth or tooth pain, No sore throat RESPIRATORY: No cough, wheezing, SOB CARDIAC: No chest pain, palpitations, lower extremity edema  GI: No abdominal pain, No N/V/D or constipation, No heartburn or reflux  GU: No dysuria, frequency or urgency, or incontinence  MUSCULOSKELETAL: No unrelieved bone/joint pain NEUROLOGIC: No  headache, dizziness or focal weakness PSYCHIATRIC: No overt anxiety or sadness, No behavior issue.   Filed Vitals:   02/13/14 1037  BP: 138/76  Pulse: 62  Temp: 98 F (36.7 C)  Resp: 18    Physical Exam  GENERAL APPEARANCE: Alert, modconversant,  No acute distress.  SKIN: No diaphoresis rash HEAD: Normocephalic, atraumatic  EYES: Conjunctiva/lids clear. Pupils round, reactive. EOMs intact.  EARS: External exam WNL, canals clear. Hearing grossly normal.  NOSE: No deformity or discharge.  MOUTH/THROAT: Lips w/o lesions  RESPIRATORY: Breathing is even, unlabored. Lung sounds are clear   CARDIOVASCULAR: Heart RRR no murmurs, rubs or gallops. No peripheral edema.   GASTROINTESTINAL: Abdomen is soft, non-tender, not distended w/ normal bowel sounds. GENITOURINARY: Bladder non tender, not distended  MUSCULOSKELETAL: No abnormal joints or musculature NEUROLOGIC:  Cranial nerves 2-12 grossly intact. Moves all extremities  PSYCHIATRIC: odd affect, no behavioral issues  Patient Active Problem List   Diagnosis Date Noted  . CKD (chronic kidney disease) stage 3, GFR 30-59 ml/min 02/13/2014  . Aggression 12/27/2013  . Dementia with behavioral disturbance 12/27/2013  . Dyspnea 11/17/2010  . Peripheral autonomic neuropathy due to DM 07/31/2008  . Hyperlipidemia LDL goal <70 07/31/2008  . OBESITY 07/31/2008  . LEUKOCYTOSIS 07/31/2008  . DEPRESSION 07/31/2008  . Essential hypertension 07/31/2008  . Coronary atherosclerosis 07/31/2008  . CAROTID ARTERY STENOSIS 07/31/2008  . CVA 07/31/2008  . POLYMYALGIA RHEUMATICA 07/31/2008  . DIABETES MELLITUS, TYPE II, HX OF 07/31/2008  . ANEURYSM, HX OF 07/31/2008    CBC    Component Value Date/Time   WBC 13.4* 12/26/2013 2052   RBC 4.10 12/26/2013 2052   HGB 12.5 12/26/2013 2052   HCT 38.9 12/26/2013 2052   PLT 138* 12/26/2013 2052   MCV 94.9 12/26/2013 2052   LYMPHSABS 4.2* 12/26/2013 2052   MONOABS 1.1* 12/26/2013 2052   EOSABS 0.1 12/26/2013  2052   BASOSABS 0.0 12/26/2013 2052    CMP     Component Value Date/Time   NA 143 12/26/2013 2052   K 4.1 12/26/2013 2052   CL 102 12/26/2013 2052   CO2 23 12/26/2013 2052   GLUCOSE 166* 12/26/2013 2052   BUN 24* 12/26/2013 2052   CREATININE 0.92 12/26/2013 2052   CALCIUM 9.4 12/26/2013 2052   PROT 7.5 12/26/2013 2052   ALBUMIN 3.8 12/26/2013 2052   AST 42* 12/26/2013 2052   ALT 22 12/26/2013 2052   ALKPHOS 83 12/26/2013 2052   BILITOT 0.4 12/26/2013 2052   GFRNONAA 55* 12/26/2013 2052   GFRAA 64* 12/26/2013 2052    Assessment and Plan  Dementia with behavioral disturbance Pt was [facility] psych inpt under invol commitment on 8/30 for aggressive behavoirs. Multiple psychotropic and other meds tried and pt d/c on 10/13reported stable withput behavoirs.  Peripheral autonomic neuropathy due to DM Continue neurontin 100 gm BID  Coronary atherosclerosis Pt on ASA, BBlocker, imdur, statin  Essential hypertension Controlled on multiple agents  Hyperlipidemia LDL goal <70 On pravachol 10 mg LDL46,HDL 28 in 12/23/2013  CKD (chronic kidney disease) stage 3, GFR 30-59 ml/min GFR 58 12/26/2013; BUN 23, Cr 0.90    Margit HanksALEXANDER, Lenette Rau D, MD

## 2014-02-13 NOTE — Assessment & Plan Note (Signed)
Continue neurontin 100 gm BID

## 2014-02-13 NOTE — Assessment & Plan Note (Signed)
Pt was committed to [facility] as a psych inpt under invol commitment on 8/30 for aggressive behavoirs. Multiple psychotropic and other meds tried and pt d/c on 10/13reported stable withput behavoirs.

## 2014-02-13 NOTE — Assessment & Plan Note (Signed)
Controlled on multiple agents

## 2014-02-13 NOTE — Assessment & Plan Note (Signed)
On pravachol 10 mg LDL46,HDL 28 in 12/23/2013

## 2014-02-13 NOTE — Assessment & Plan Note (Signed)
GFR 58 12/26/2013; BUN 23, Cr 0.90

## 2014-02-15 ENCOUNTER — Encounter: Payer: Self-pay | Admitting: Internal Medicine

## 2014-03-03 ENCOUNTER — Other Ambulatory Visit: Payer: Self-pay | Admitting: *Deleted

## 2014-03-03 MED ORDER — LORAZEPAM 2 MG/ML IJ SOLN: INTRAMUSCULAR | Status: DC

## 2014-03-03 NOTE — Telephone Encounter (Signed)
[facility] 

## 2014-03-03 NOTE — Telephone Encounter (Signed)
[facility] 

## 2014-04-28 ENCOUNTER — Non-Acute Institutional Stay (SKILLED_NURSING_FACILITY): Payer: PRIVATE HEALTH INSURANCE | Admitting: Internal Medicine

## 2014-04-28 DIAGNOSIS — F0391 Unspecified dementia with behavioral disturbance: Secondary | ICD-10-CM

## 2014-04-28 DIAGNOSIS — F312 Bipolar disorder, current episode manic severe with psychotic features: Secondary | ICD-10-CM

## 2014-04-28 DIAGNOSIS — F03918 Unspecified dementia, unspecified severity, with other behavioral disturbance: Secondary | ICD-10-CM

## 2014-04-28 DIAGNOSIS — I10 Essential (primary) hypertension: Secondary | ICD-10-CM

## 2014-04-28 NOTE — Progress Notes (Signed)
MRN: [id number] Name: [name] [name]  Sex: female Age: 80 y.o. DOB: 05/16/1926  [name] Level Of Care: SNF Provider: Merrilee SeashoreALEXANDER, Azaria Bartell D Emergency Contacts: Extended Emergency Contact Information Primary Emergency Contact: [name],[name] Address: [address]          [place], [place] [address] [place] Home Phone: [phone] Mobile Phone: [phone] Relation: Daughter Secondary Emergency Contact: Ileana Laddalhoun,Therman Address: [address]          [place], [place] [address] [place] Home Phone: [phone] Mobile Phone: [phone] Relation: Son  Code Status: DNR  Allergies: Codeine; Hydrocodone; Ibuprofen; Penicillins; and Sulfonamide derivatives  Chief Complaint  Patient presents with  . Medical Management of Chronic Issues    HPI: Patient is 80 y.o. female who is being seen for chronic problems.  Past Medical History  Diagnosis Date  . Coronary atherosclerosis of unspecified type of vessel, native or graft   . Depressive disorder, not elsewhere classified   . Type II or unspecified type diabetes mellitus with neurological manifestations, not stated as uncontrolled   . Other and unspecified hyperlipidemia   . Unspecified essential hypertension   . Occlusion and stenosis of carotid artery without mention of cerebral infarction   . Personal history of other diseases of circulatory system   . Unspecified cerebral artery occlusion with cerebral infarction   . Polymyalgia rheumatica   . Obesity, unspecified   . Leukocytosis, unspecified     Past Surgical History  Procedure Laterality Date  . Total abdominal hysterectomy    . Appendectomy    . Cataract surgery      bilateral w intraocular placement  . Refractive surgery      both eyes x6  . Posterior laminectomy / decompression cervical spine      w diskectomy and fusion of c5-c7 and anterior cervical plate  . Surgery of kidney stones      a few years  ago      Medication List       This list is accurate as of: 04/28/14 11:59 PM.  Always use your most recent med list.               amLODipine 10 MG tablet  Commonly known as:  NORVASC  Take 10 mg by mouth every morning.     aspirin 81 MG chewable tablet  Chew 81 mg by mouth every morning.     atenolol 25 MG tablet  Commonly known as:  TENORMIN  Take 25 mg by mouth every morning.     cetirizine 10 MG tablet  Commonly known as:  ZYRTEC  Take 10 mg by mouth every morning.     clonazePAM 0.5 MG tablet  Commonly known as:  KLONOPIN  Take one tablet by mouth twice daily     divalproex 500 MG DR tablet  Commonly known as:  DEPAKOTE  Take 500 mg by mouth 3 (three) times daily.     ferrous sulfate 325 (65 FE) MG tablet  Take 325 mg by mouth daily with breakfast.     gabapentin 100 MG capsule  Commonly known as:  NEURONTIN  Take 100 mg by mouth 2 (two) times daily.     insulin detemir 100 UNIT/ML injection  Commonly known as:  LEVEMIR  Inject 50 Units into the skin at bedtime.     ipratropium-albuterol 0.5-2.5 (3) MG/3ML Soln  Commonly known as:  DUONEB  Take 3 mLs by nebulization every 6 (six) hours as  needed (shortness of breath).     isosorbide mononitrate 30 MG 24 hr tablet  Commonly known as:  IMDUR  Take 30 mg by mouth every evening.     linagliptin 5 MG Tabs tablet  Commonly known as:  TRADJENTA  Take 5 mg by mouth daily.     lisinopril 5 MG tablet  Commonly known as:  PRINIVIL,ZESTRIL  Take 5 mg by mouth daily.     LORazepam 2 MG/ML injection  Commonly known as:  ATIVAN  Give 0.79ml intramuscular every 6 hours as needed for agitation/aggressive behavior     nitroGLYCERIN 0.4 MG SL tablet  Commonly known as:  NITROSTAT  Place 0.4 mg under the tongue every 5 (five) minutes as needed for chest pain.     omeprazole 20 MG capsule  Commonly known as:  PRILOSEC  Take 20 mg by mouth daily.     pravastatin 10 MG tablet  Commonly known as:  PRAVACHOL   Take 5 mg by mouth daily.     QUEtiapine 25 MG tablet  Commonly known as:  SEROQUEL  Take 25 mg by mouth 2 (two) times daily.     saccharomyces boulardii 250 MG capsule  Commonly known as:  FLORASTOR  Take 250 mg by mouth 2 (two) times daily.     senna 8.6 MG Tabs tablet  Commonly known as:  SENOKOT  Take 2 tablets by mouth at bedtime.     sucralfate 1 G tablet  Commonly known as:  CARAFATE  Take 1 g by mouth every 6 (six) hours.        No orders of the defined types were placed in this encounter.    Immunization History  Administered Date(s) Administered  . Influenza Whole 01/30/2013    History  Substance Use Topics  . Smoking status: Never Smoker   . Smokeless tobacco: Not on file  . Alcohol Use: No    Review of Systems  DATA OBTAINED: from patient GENERAL:  no fevers, fatigue, appetite changes SKIN: No itching, rash HEENT: No complaint RESPIRATORY: No cough, wheezing, SOB CARDIAC: No chest pain, palpitations, lower extremity edema  GI: No abdominal pain, No N/V/D or constipation, No heartburn or reflux  GU: No dysuria, frequency or urgency, or incontinence  MUSCULOSKELETAL: No unrelieved bone/joint pain NEUROLOGIC: No headache, dizziness  PSYCHIATRIC: No overt anxiety or sadness  Filed Vitals:   04/28/14 2123  BP: 171/67  Pulse: 68  Temp: 97 F (36.1 C)  Resp: 16    Physical Exam  GENERAL APPEARANCE: Alert, mod conversant, No acute distress  SKIN: No diaphoresis rash, or wounds HEENT: Unremarkable RESPIRATORY: Breathing is even, unlabored. Lung sounds are clear   CARDIOVASCULAR: Heart RRR no murmurs, rubs or gallops. No peripheral edema  GASTROINTESTINAL: Abdomen is soft, non-tender, not distended w/ normal bowel sounds.  GENITOURINARY: Bladder non tender, not distended  MUSCULOSKELETAL: No abnormal joints or musculature NEUROLOGIC: Cranial nerves 2-12 grossly intact. Moves all extremities PSYCHIATRIC: odd, confused but no behavioral  issues  Patient Active Problem List   Diagnosis Date Noted  . Bipolar disorder 05/05/2014  . CKD (chronic kidney disease) stage 3, GFR 30-59 ml/min 02/13/2014  . Aggression 12/27/2013  . Dementia with behavioral disturbance 12/27/2013  . Dyspnea 11/17/2010  . Peripheral autonomic neuropathy due to DM 07/31/2008  . Hyperlipidemia LDL goal <70 07/31/2008  . OBESITY 07/31/2008  . LEUKOCYTOSIS 07/31/2008  . DEPRESSION 07/31/2008  . Essential hypertension 07/31/2008  . Coronary atherosclerosis 07/31/2008  . CAROTID ARTERY STENOSIS 07/31/2008  .  CVA 07/31/2008  . POLYMYALGIA RHEUMATICA 07/31/2008  . DIABETES MELLITUS, TYPE II, HX OF 07/31/2008  . ANEURYSM, HX OF 07/31/2008    CBC    Component Value Date/Time   WBC 13.4* 12/26/2013 2052   RBC 4.10 12/26/2013 2052   HGB 12.5 12/26/2013 2052   HCT 38.9 12/26/2013 2052   PLT 138* 12/26/2013 2052   MCV 94.9 12/26/2013 2052   LYMPHSABS 4.2* 12/26/2013 2052   MONOABS 1.1* 12/26/2013 2052   EOSABS 0.1 12/26/2013 2052   BASOSABS 0.0 12/26/2013 2052    CMP     Component Value Date/Time   NA 143 12/26/2013 2052   K 4.1 12/26/2013 2052   CL 102 12/26/2013 2052   CO2 23 12/26/2013 2052   GLUCOSE 166* 12/26/2013 2052   BUN 24* 12/26/2013 2052   CREATININE 0.92 12/26/2013 2052   CALCIUM 9.4 12/26/2013 2052   PROT 7.5 12/26/2013 2052   ALBUMIN 3.8 12/26/2013 2052   AST 42* 12/26/2013 2052   ALT 22 12/26/2013 2052   ALKPHOS 83 12/26/2013 2052   BILITOT 0.4 12/26/2013 2052   GFRNONAA 55* 12/26/2013 2052   GFRAA 64* 12/26/2013 2052    Assessment and Plan  Bipolar disorder Chronic with exacerbation of mania/psychosis since last visit; multiple meds per psych including depakote, seroquel and klonopin with prn IM ativan for acute episodes  Dementia with behavioral disturbance Dementoi impacts on bipolar and vice versa;short term memory problems and confusion ongoing, inpt stay and med refinement is basis for current  control  Essential hypertension Not controlled today, but has been prior - will follow, several med changes could easily be done, will discuss with [facility] provider.    Margit HanksALEXANDER, Smith Mcnicholas D, MD

## 2014-05-05 ENCOUNTER — Encounter: Payer: Self-pay | Admitting: Internal Medicine

## 2014-05-05 DIAGNOSIS — F319 Bipolar disorder, unspecified: Secondary | ICD-10-CM | POA: Insufficient documentation

## 2014-05-05 NOTE — Assessment & Plan Note (Signed)
Dementoi impacts on bipolar and vice versa;short term memory problems and confusion ongoing, inpt stay and med refinement is basis for current control

## 2014-05-05 NOTE — Assessment & Plan Note (Signed)
Chronic with exacerbation of mania/psychosis since last visit; multiple meds per psych including depakote, seroquel and klonopin with prn IM ativan for acute episodes

## 2014-05-05 NOTE — Assessment & Plan Note (Signed)
Not controlled today, but has been prior - will follow, several med changes could easily be done, will discuss with [facility] provider.

## 2014-05-18 ENCOUNTER — Other Ambulatory Visit: Payer: Self-pay | Admitting: *Deleted

## 2014-05-18 MED ORDER — CLONAZEPAM 0.5 MG PO TABS: ORAL_TABLET | ORAL | Status: AC

## 2014-05-18 NOTE — Telephone Encounter (Signed)
[facility] 

## 2014-06-05 ENCOUNTER — Non-Acute Institutional Stay (SKILLED_NURSING_FACILITY): Payer: Medicare Other | Admitting: Internal Medicine

## 2014-06-05 ENCOUNTER — Encounter: Payer: Self-pay | Admitting: Internal Medicine

## 2014-06-05 DIAGNOSIS — F01518 Vascular dementia, unspecified severity, with other behavioral disturbance: Secondary | ICD-10-CM

## 2014-06-05 DIAGNOSIS — N183 Chronic kidney disease, stage 3 unspecified: Secondary | ICD-10-CM

## 2014-06-05 DIAGNOSIS — I10 Essential (primary) hypertension: Secondary | ICD-10-CM

## 2014-06-05 DIAGNOSIS — K219 Gastro-esophageal reflux disease without esophagitis: Secondary | ICD-10-CM

## 2014-06-05 DIAGNOSIS — F312 Bipolar disorder, current episode manic severe with psychotic features: Secondary | ICD-10-CM

## 2014-06-05 DIAGNOSIS — E785 Hyperlipidemia, unspecified: Secondary | ICD-10-CM

## 2014-06-05 DIAGNOSIS — E118 Type 2 diabetes mellitus with unspecified complications: Secondary | ICD-10-CM

## 2014-06-05 DIAGNOSIS — F0151 Vascular dementia with behavioral disturbance: Secondary | ICD-10-CM

## 2014-06-05 DIAGNOSIS — I5032 Chronic diastolic (congestive) heart failure: Secondary | ICD-10-CM

## 2014-06-05 NOTE — Assessment & Plan Note (Signed)
Pt's on atenolol daily, which is a decrease, and ACE; pt has not needed lasix

## 2014-06-05 NOTE — Assessment & Plan Note (Signed)
GFR 57.89,CrCl-41 in 05/2014 so stable; previously documented as ACE intolerant but [facility] and [facility] tolerating so far;will continue to monitor

## 2014-06-05 NOTE — Assessment & Plan Note (Signed)
Chronic and stable on PPI and carafate;pt with h/oGI bleed

## 2014-06-05 NOTE — Assessment & Plan Note (Signed)
Pt on low dose statin pravachol 5 mg with LDL -48, HDL -32 in 05/2014;Continue pravachol 5 mg

## 2014-06-05 NOTE — Assessment & Plan Note (Signed)
Pt had exacerbation of behavoir with prednisone given for another problem, improved when prednisone was d/c ;Ativan q6 1 mg IM prn has been ordered for acute exacerbations

## 2014-06-05 NOTE — Progress Notes (Signed)
MRN: [id number] Name: [name] [name]  Sex: female Age: 81 y.o. DOB: 1927-01-27  [facility] #: [name] Facility/Room:208D Level Of Care: SNF Provider: Merrilee Seashore D Emergency Contacts: Extended Emergency Contact Information Primary Emergency Contact: [name],[name] Address: [address]          [place], [place] [address] [name] of [place] Home Phone: [phone] Mobile Phone: [phone] Relation: Daughter Secondary Emergency Contact: [name] Address: [address] [place] [address] [name] of [place] Home Phone: [phone] Mobile Phone: [phone] Relation: Son  Code Status: DNR  Allergies: Codeine; Hydrocodone; Ibuprofen; Penicillins; and Sulfonamide derivatives  Chief Complaint  Patient presents with  . Medical Management of Chronic Issues    HPI: Patient is 81 y.o. female who is being seen for routine issues.  Past Medical History  Diagnosis Date  . Coronary atherosclerosis of unspecified type of vessel, native or graft   . Depressive disorder, not elsewhere classified   . Type II or unspecified type diabetes mellitus with neurological manifestations, not stated as uncontrolled   . Other and unspecified hyperlipidemia   . Unspecified essential hypertension   . Occlusion and stenosis of carotid artery without mention of cerebral infarction   . Personal history of other diseases of circulatory system   . Unspecified cerebral artery occlusion with cerebral infarction   . Polymyalgia rheumatica   . Obesity, unspecified   . Leukocytosis, unspecified     Past Surgical History  Procedure Laterality Date  . Total abdominal hysterectomy    . Appendectomy    . Cataract surgery      bilateral w intraocular placement  . Refractive surgery      both eyes x6  . Posterior laminectomy / decompression cervical spine      w diskectomy and fusion of c5-c7 and anterior cervical plate  . Surgery of kidney stones      a few years  ago      Medication List       This list is accurate as of: 06/05/14  9:00 PM.  Always use your most recent med list.               amLODipine 10 MG tablet  Commonly known as:  NORVASC  Take 10 mg by mouth every morning.     aspirin 81 MG chewable tablet  Chew 81 mg by mouth every morning.     atenolol 25 MG tablet  Commonly known as:  TENORMIN  Take 25 mg by mouth every morning.     cetirizine 10 MG tablet  Commonly known as:  ZYRTEC  Take 10 mg by mouth every morning.     clonazePAM 0.5 MG tablet  Commonly known as:  KLONOPIN  Take one tablet by mouth twice daily     divalproex 500 MG DR tablet  Commonly known as:  DEPAKOTE  Take 500 mg by mouth 3 (three) times daily.     ferrous sulfate 325 (65 FE) MG tablet  Take 325 mg by mouth daily with breakfast.     gabapentin 100 MG capsule  Commonly known as:  NEURONTIN  Take 100 mg by mouth 2 (two) times daily.     insulin detemir 100 UNIT/ML injection  Commonly known as:  LEVEMIR  Inject 50 Units into the skin at bedtime.     ipratropium-albuterol 0.5-2.5 (3) MG/3ML Soln  Commonly known as:  DUONEB  Take 3 mLs by nebulization every 6 (six) hours  as needed (shortness of breath).     isosorbide mononitrate 30 MG 24 hr tablet  Commonly known as:  IMDUR  Take 30 mg by mouth every evening.     linagliptin 5 MG Tabs tablet  Commonly known as:  TRADJENTA  Take 5 mg by mouth daily.     lisinopril 5 MG tablet  Commonly known as:  PRINIVIL,ZESTRIL  Take 5 mg by mouth daily.     LORazepam 2 MG/ML injection  Commonly known as:  ATIVAN  Give 0.765ml intramuscular every 6 hours as needed for agitation/aggressive behavior     nitroGLYCERIN 0.4 MG SL tablet  Commonly known as:  NITROSTAT  Place 0.4 mg under the tongue every 5 (five) minutes as needed for chest pain.     omeprazole 20 MG capsule  Commonly known as:  PRILOSEC  Take 20 mg by mouth daily.     pravastatin 10 MG tablet  Commonly known as:  PRAVACHOL   Take 5 mg by mouth daily.     QUEtiapine 25 MG tablet  Commonly known as:  SEROQUEL  Take 25 mg by mouth 2 (two) times daily.     saccharomyces boulardii 250 MG capsule  Commonly known as:  FLORASTOR  Take 250 mg by mouth 2 (two) times daily.     senna 8.6 MG Tabs tablet  Commonly known as:  SENOKOT  Take 2 tablets by mouth at bedtime.     sucralfate 1 G tablet  Commonly known as:  CARAFATE  Take 1 g by mouth every 6 (six) hours.        No orders of the defined types were placed in this encounter.    Immunization History  Administered Date(s) Administered  . Influenza Whole 01/30/2013    History  Substance Use Topics  . Smoking status: Never Smoker   . Smokeless tobacco: Not on file  . Alcohol Use: No    Review of Systems  DATA OBTAINED: from patient GENERAL:  no fevers, fatigue, appetite changes SKIN: No itching, rash HEENT: No complaint RESPIRATORY: No cough, wheezing, SOB CARDIAC: No chest pain, palpitations, lower extremity edema  GI: No abdominal pain, No N/V/D or constipation, No heartburn or reflux  GU: No dysuria, frequency or urgency, or incontinence  MUSCULOSKELETAL: No unrelieved bone/joint pain NEUROLOGIC: No headache, dizziness  PSYCHIATRIC: No overt anxiety or sadness  Filed Vitals:   06/05/14 1911  BP: 171/67  Pulse: 76  Temp: 97.6 F (36.4 C)  Resp: 18    Physical Exam  GENERAL APPEARANCE: Alert,min  conversant, No acute distress  SKIN: No diaphoresis rash HEENT: Unremarkable RESPIRATORY: Breathing is even, unlabored. Lung sounds are clear   CARDIOVASCULAR: Heart RRR no murmurs, rubs or gallops. No peripheral edema  GASTROINTESTINAL: Abdomen is soft, non-tender, not distended w/ normal bowel sounds.  GENITOURINARY: Bladder non tender, not distended  MUSCULOSKELETAL: No abnormal joints or musculature NEUROLOGIC: Cranial nerves 2-12 grossly intact. Moves all extremities PSYCHIATRIC: odd  Patient Active Problem List   Diagnosis  Date Noted  . Diastolic CHF, chronic 06/05/2014  . DM (diabetes mellitus), type 2 with complications 06/05/2014  . GERD (gastroesophageal reflux disease) 06/05/2014  . Bipolar disorder 05/05/2014  . CKD (chronic kidney disease) stage 3, GFR 30-59 ml/min 02/13/2014  . Aggression 12/27/2013  . Vascular dementia with behavioral disturbance 12/27/2013  . Dyspnea 11/17/2010  . Peripheral autonomic neuropathy due to DM 07/31/2008  . Hyperlipidemia LDL goal <70 07/31/2008  . OBESITY 07/31/2008  . LEUKOCYTOSIS 07/31/2008  .  DEPRESSION 07/31/2008  . Essential hypertension 07/31/2008  . Coronary atherosclerosis 07/31/2008  . CAROTID ARTERY STENOSIS 07/31/2008  . CVA 07/31/2008  . POLYMYALGIA RHEUMATICA 07/31/2008  . DIABETES MELLITUS, TYPE II, HX OF 07/31/2008  . ANEURYSM, HX OF 07/31/2008    CBC    Component Value Date/Time   WBC 13.4* 12/26/2013 2052   RBC 4.10 12/26/2013 2052   HGB 12.5 12/26/2013 2052   HCT 38.9 12/26/2013 2052   PLT 138* 12/26/2013 2052   MCV 94.9 12/26/2013 2052   LYMPHSABS 4.2* 12/26/2013 2052   MONOABS 1.1* 12/26/2013 2052   EOSABS 0.1 12/26/2013 2052   BASOSABS 0.0 12/26/2013 2052    CMP     Component Value Date/Time   NA 143 12/26/2013 2052   K 4.1 12/26/2013 2052   CL 102 12/26/2013 2052   CO2 23 12/26/2013 2052   GLUCOSE 166* 12/26/2013 2052   BUN 24* 12/26/2013 2052   CREATININE 0.92 12/26/2013 2052   CALCIUM 9.4 12/26/2013 2052   PROT 7.5 12/26/2013 2052   ALBUMIN 3.8 12/26/2013 2052   AST 42* 12/26/2013 2052   ALT 22 12/26/2013 2052   ALKPHOS 83 12/26/2013 2052   BILITOT 0.4 12/26/2013 2052   GFRNONAA 55* 12/26/2013 2052   GFRAA 64* 12/26/2013 2052    Assessment and Plan  Essential hypertension Again , not well controlled ;Plan to inc lisinopril to 10 mg daily   Diastolic CHF, chronic Pt's on atenolol daily, which is a decrease, and ACE; pt has not needed lasix   DM (diabetes mellitus), type 2 with complications A1c 7.5 in  01/2014 followed by 8.8 in 05/2014. Probably reflects decreased compliance with diet now that she is not in a closed psych ward; plan inc levemir 26 mg BID and continue titrate up as needed., cont Tragenta 5 mg daily   CKD (chronic kidney disease) stage 3, GFR 30-59 ml/min GFR 57.89,CrCl-41 in 05/2014 so stable; previously documented as ACE intolerant but was started on lisinopril 5 mg by [facility] and pt tolerating so far;will continue to monitor   GERD (gastroesophageal reflux disease) Chronic and stable on PPI and carafate;pt with h/oGI bleed   Hyperlipidemia LDL goal <70 Pt on low dose statin pravachol 5 mg with LDL -48, HDL -32 in 05/2014;Continue pravachol 5 mg   Vascular dementia with behavioral disturbance Dementia is progressive, behavoirs have been controlled with current meds   Bipolar disorder Pt had exacerbation of behavoir with prednisone given for another problem, improved when prednisone was d/c ;Ativan q6 1 mg IM prn has been ordered for acute exacerbations     Margit Hanks, MD

## 2014-06-05 NOTE — Assessment & Plan Note (Signed)
Again , not well controlled ;Plan to inc lisinopril to 10 mg daily

## 2014-06-05 NOTE — Assessment & Plan Note (Signed)
A1c 7.5 in 01/2014 followed by 8.8 in 05/2014. Probably reflects decreased compliance with diet now that she is not in a closed psych ward; plan inc levemir 26 mg BID and continue titrate up as needed., cont Tragenta 5 mg daily

## 2014-06-05 NOTE — Assessment & Plan Note (Signed)
Dementia is progressive, behavoirs have been controlled with current meds

## 2014-06-09 ENCOUNTER — Emergency Department (HOSPITAL_BASED_OUTPATIENT_CLINIC_OR_DEPARTMENT_OTHER)
Admission: EM | Admit: 2014-06-09 | Discharge: 2014-06-09 | Disposition: A | Discharge status: Home / Self Care | Payer: Medicare Other | Attending: Emergency Medicine | Admitting: Emergency Medicine

## 2014-06-09 ENCOUNTER — Encounter (HOSPITAL_BASED_OUTPATIENT_CLINIC_OR_DEPARTMENT_OTHER): Payer: Self-pay | Admitting: *Deleted

## 2014-06-09 ENCOUNTER — Emergency Department (HOSPITAL_BASED_OUTPATIENT_CLINIC_OR_DEPARTMENT_OTHER): Payer: Medicare Other

## 2014-06-09 DIAGNOSIS — W19XXXA Unspecified fall, initial encounter: Secondary | ICD-10-CM

## 2014-06-09 DIAGNOSIS — S0990XA Unspecified injury of head, initial encounter: Secondary | ICD-10-CM | POA: Diagnosis present

## 2014-06-09 DIAGNOSIS — E669 Obesity, unspecified: Secondary | ICD-10-CM | POA: Insufficient documentation

## 2014-06-09 DIAGNOSIS — I2510 Atherosclerotic heart disease of native coronary artery without angina pectoris: Secondary | ICD-10-CM | POA: Diagnosis not present

## 2014-06-09 DIAGNOSIS — W1830XA Fall on same level, unspecified, initial encounter: Secondary | ICD-10-CM | POA: Insufficient documentation

## 2014-06-09 DIAGNOSIS — Z7982 Long term (current) use of aspirin: Secondary | ICD-10-CM | POA: Insufficient documentation

## 2014-06-09 DIAGNOSIS — Z79899 Other long term (current) drug therapy: Secondary | ICD-10-CM | POA: Insufficient documentation

## 2014-06-09 DIAGNOSIS — Y998 Other external cause status: Secondary | ICD-10-CM | POA: Insufficient documentation

## 2014-06-09 DIAGNOSIS — Y9289 Other specified places as the place of occurrence of the external cause: Secondary | ICD-10-CM | POA: Insufficient documentation

## 2014-06-09 DIAGNOSIS — F329 Major depressive disorder, single episode, unspecified: Secondary | ICD-10-CM | POA: Insufficient documentation

## 2014-06-09 DIAGNOSIS — I10 Essential (primary) hypertension: Secondary | ICD-10-CM | POA: Insufficient documentation

## 2014-06-09 DIAGNOSIS — Z862 Personal history of diseases of the blood and blood-forming organs and certain disorders involving the immune mechanism: Secondary | ICD-10-CM | POA: Insufficient documentation

## 2014-06-09 DIAGNOSIS — E119 Type 2 diabetes mellitus without complications: Secondary | ICD-10-CM | POA: Diagnosis not present

## 2014-06-09 DIAGNOSIS — E785 Hyperlipidemia, unspecified: Secondary | ICD-10-CM | POA: Diagnosis not present

## 2014-06-09 DIAGNOSIS — Z880 Allergy status to penicillin: Secondary | ICD-10-CM | POA: Diagnosis not present

## 2014-06-09 DIAGNOSIS — S098XXA Other specified injuries of head, initial encounter: Secondary | ICD-10-CM | POA: Diagnosis not present

## 2014-06-09 DIAGNOSIS — Z794 Long term (current) use of insulin: Secondary | ICD-10-CM | POA: Diagnosis not present

## 2014-06-09 DIAGNOSIS — Y9389 Activity, other specified: Secondary | ICD-10-CM | POA: Insufficient documentation

## 2014-06-09 DIAGNOSIS — Z8673 Personal history of transient ischemic attack (TIA), and cerebral infarction without residual deficits: Secondary | ICD-10-CM | POA: Diagnosis not present

## 2014-06-09 NOTE — ED Notes (Signed)
Spoke with pt's son- call transferred into pt's room so he could speak to her

## 2014-06-09 NOTE — ED Notes (Signed)
EMS transport from [facility]- found out of wheelchair by nursing home staff- hematoma to back of head- pt does not recall fall

## 2014-06-09 NOTE — Discharge Instructions (Signed)
Fall Prevention in Hospitals °As a hospital patient, your condition and the treatments you receive can increase your risk for falls. Some additional risk factors for falls in a hospital include: °· Being in an unfamiliar environment. °· Being on bed rest. °· Your surgery. °· Taking certain medicines. °· Your tubing requirements, such as intravenous (IV) therapy or catheters. °It is important that you learn how to decrease fall risks while at the hospital. Below are important tips that can help prevent falls. °SAFETY TIPS FOR PREVENTING FALLS °Talk about your risk of falling. °· Ask your caregiver why you are at risk for falling. Is it your medicine, illness, tubing placement, or something else? °· Make a plan with your caregiver to keep you safe from falls. °· Ask your caregiver or pharmacist about side effect of your medicines. Some medicines can make you dizzy or affect your coordination. °Ask for help. °· Ask for help before getting out of bed. You may need to press your call button. °· Ask for assistance in getting you safely to the toilet. °· Ask for a walker or cane to be put at your bedside. Ask that most of the side rails on your bed be placed up before your caregiver leaves the room. °· Ask family or friends to sit with you. °· Ask for things that are out of your reach, such as your glasses, hearing aids, telephone, bedside table, or call button. °Follow these tips to avoid falling: °· Stay lying or seated, rather than standing, while waiting for help. °· Wear rubber-soled slippers or shoes whenever you walk in the hospital. °· Avoid quick, sudden movements. °¨ Change positions slowly. °¨ Sit on the side of your bed before standing. °¨ Stand up slowly and wait before you start to walk. °· Let your caregiver know if there is a spill on the floor. °· Pay careful attention to the medical equipment, electrical cords, and tubes around you. °· When you need help, use your call button by your bed or in the  bathroom. Wait for one of your caregivers to help you. °· If you feel dizzy or unsure of your footing, return to bed and wait for assistance. °· Avoid being distracted by the TV, telephone, or another person in your room. °· Do not lean or support yourself on rolling objects, such as IV poles or bedside tables. °Document Released: 04/14/2000 Document Revised: 04/03/2012 Document Reviewed: 12/24/2011 °ExitCare® Patient Information ©2015 [employer]. This information is not intended to replace advice given to you by your health care provider. Make sure you discuss any questions you have with your health care provider. ° °

## 2014-06-09 NOTE — ED Notes (Signed)
Spoke to pt's grandson Elsie LincolnJosh Schermerhorn- contact # [phone]

## 2014-06-09 NOTE — ED Notes (Signed)
MD at bedside. 

## 2014-06-09 NOTE — ED Provider Notes (Signed)
CSN: [id number]     Arrival date & time 06/09/14  1134 History   First MD Initiated Contact with Patient 06/09/14 1145     Chief Complaint  Patient presents with  . Fall      HPI EMS transport from [facility]- found out of wheelchair by nursing home staff- hematoma to back of head- pt does not recall fall Past Medical History  Diagnosis Date  . Coronary atherosclerosis of unspecified type of vessel, native or graft   . Depressive disorder, not elsewhere classified   . Type II or unspecified type diabetes mellitus with neurological manifestations, not stated as uncontrolled   . Other and unspecified hyperlipidemia   . Unspecified essential hypertension   . Occlusion and stenosis of carotid artery without mention of cerebral infarction   . Personal history of other diseases of circulatory system   . Unspecified cerebral artery occlusion with cerebral infarction   . Polymyalgia rheumatica   . Obesity, unspecified   . Leukocytosis, unspecified    Past Surgical History  Procedure Laterality Date  . Total abdominal hysterectomy    . Appendectomy    . Cataract surgery      bilateral w intraocular placement  . Refractive surgery      both eyes x6  . Posterior laminectomy / decompression cervical spine      w diskectomy and fusion of c5-c7 and anterior cervical plate  . Surgery of kidney stones      a few years ago   Family History  Problem Relation Age of Onset  . Coronary artery disease Other    History  Substance Use Topics  . Smoking status: Never Smoker   . Smokeless tobacco: Not on file  . Alcohol Use: No   OB History    No data available     Review of Systems  Level V caveat  Allergies  Codeine; Hydrocodone; Ibuprofen; Penicillins; and Sulfonamide derivatives  Home Medications   Prior to Admission medications   Medication Sig Start Date End Date Taking? Authorizing Provider  amLODipine (NORVASC) 10 MG tablet Take 10 mg by mouth every  morning.     Historical Provider, MD  aspirin 81 MG chewable tablet Chew 81 mg by mouth every morning.    Historical Provider, MD  atenolol (TENORMIN) 25 MG tablet Take 25 mg by mouth every morning.     Historical Provider, MD  cetirizine (ZYRTEC) 10 MG tablet Take 10 mg by mouth every morning.     Historical Provider, MD  clonazePAM (KLONOPIN) 0.5 MG tablet Take one tablet by mouth twice daily 05/18/14   Sharon Seller, NP  divalproex (DEPAKOTE) 500 MG DR tablet Take 500 mg by mouth 3 (three) times daily.    Historical Provider, MD  ferrous sulfate 325 (65 FE) MG tablet Take 325 mg by mouth daily with breakfast.      Historical Provider, MD  gabapentin (NEURONTIN) 100 MG capsule Take 100 mg by mouth 2 (two) times daily.     Historical Provider, MD  insulin detemir (LEVEMIR) 100 UNIT/ML injection Inject 50 Units into the skin at bedtime.    Historical Provider, MD  ipratropium-albuterol (DUONEB) 0.5-2.5 (3) MG/3ML SOLN Take 3 mLs by nebulization every 6 (six) hours as needed (shortness of breath).     Historical Provider, MD  isosorbide mononitrate (IMDUR) 30 MG 24 hr tablet Take 30 mg by mouth every evening.     Historical Provider, MD  linagliptin (TRADJENTA) 5 MG TABS  tablet Take 5 mg by mouth daily.    Historical Provider, MD  lisinopril (PRINIVIL,ZESTRIL) 5 MG tablet Take 5 mg by mouth daily.    Historical Provider, MD  LORazepam (ATIVAN) 2 MG/ML injection Give 0.40ml intramuscular every 6 hours as needed for agitation/aggressive behavior 03/03/14   Kimber Relic, MD  nitroGLYCERIN (NITROSTAT) 0.4 MG SL tablet Place 0.4 mg under the tongue every 5 (five) minutes as needed for chest pain.    Historical Provider, MD  omeprazole (PRILOSEC) 20 MG capsule Take 20 mg by mouth daily.    Historical Provider, MD  pravastatin (PRAVACHOL) 10 MG tablet Take 5 mg by mouth daily.    Historical Provider, MD  QUEtiapine (SEROQUEL) 25 MG tablet Take 25 mg by mouth 2 (two) times daily.    Historical Provider,  MD  saccharomyces boulardii (FLORASTOR) 250 MG capsule Take 250 mg by mouth 2 (two) times daily.    Historical Provider, MD  senna (SENOKOT) 8.6 MG TABS Take 2 tablets by mouth at bedtime.     Historical Provider, MD  sucralfate (CARAFATE) 1 G tablet Take 1 g by mouth every 6 (six) hours.    Historical Provider, MD   BP 171/86 mmHg  Pulse 76  Temp(Src) 98.6 F (37 C) (Oral)  Resp 18  SpO2 97% Physical Exam  Constitutional: She appears well-developed and well-nourished. No distress.  HENT:  Head: Normocephalic.    Eyes: Pupils are equal, round, and reactive to light.  Neck: Normal range of motion.    Cardiovascular: Normal rate and intact distal pulses.   Pulmonary/Chest: No respiratory distress.  Abdominal: Normal appearance. She exhibits no distension. There is no tenderness. There is no rebound.  Musculoskeletal: Normal range of motion.  Neurological: She is alert. She has normal strength. No cranial nerve deficit.  Skin: Skin is warm and dry. No rash noted.  Psychiatric: She has a normal mood and affect.  Nursing note and vitals reviewed.   ED Course  Procedures (including critical care time) Labs Review Labs Reviewed - No data to display  Imaging Review Ct Head Wo Contrast  06/09/2014   CLINICAL DATA:  Unwitnessed fall. Hematoma and abrasion to back of head.  EXAM: CT HEAD WITHOUT CONTRAST  CT CERVICAL SPINE WITHOUT CONTRAST  TECHNIQUE: Multidetector CT imaging of the head and cervical spine was performed following the standard protocol without intravenous contrast. Multiplanar CT image reconstructions of the cervical spine were also generated.  COMPARISON:  12/26/2013  FINDINGS: CT HEAD FINDINGS  There is atrophy and chronic small vessel disease changes. No acute intracranial abnormality. Specifically, no hemorrhage, hydrocephalus, mass lesion, acute infarction, or significant intracranial injury. No acute calvarial abnormality.  CT CERVICAL SPINE FINDINGS  Patient is status  post anterior fusion from C5-C7. No hardware complicating feature. Alignment is normal. No fracture. No epidural or paraspinal hematoma.  IMPRESSION: No acute intracranial abnormality. Atrophy, chronic small vessel disease.  No acute bony abnormality in the cervical spine. Prior anterior fusion C5-C7.   Electronically Signed   By: Charlett Nose M.D.   On: 06/09/2014 13:04   Ct Cervical Spine Wo Contrast  06/09/2014   CLINICAL DATA:  Unwitnessed fall. Hematoma and abrasion to back of head.  EXAM: CT HEAD WITHOUT CONTRAST  CT CERVICAL SPINE WITHOUT CONTRAST  TECHNIQUE: Multidetector CT imaging of the head and cervical spine was performed following the standard protocol without intravenous contrast. Multiplanar CT image reconstructions of the cervical spine were also generated.  COMPARISON:  12/26/2013  FINDINGS:  CT HEAD FINDINGS  There is atrophy and chronic small vessel disease changes. No acute intracranial abnormality. Specifically, no hemorrhage, hydrocephalus, mass lesion, acute infarction, or significant intracranial injury. No acute calvarial abnormality.  CT CERVICAL SPINE FINDINGS  Patient is status post anterior fusion from C5-C7. No hardware complicating feature. Alignment is normal. No fracture. No epidural or paraspinal hematoma.  IMPRESSION: No acute intracranial abnormality. Atrophy, chronic small vessel disease.  No acute bony abnormality in the cervical spine. Prior anterior fusion C5-C7.   Electronically Signed   By: Charlett NoseKevin  Dover M.D.   On: 06/09/2014 13:04      MDM   Final diagnoses:  Accidental fall        Nelia Shiobert L Esti Demello, MD 06/09/14 1316

## 2014-07-29 ENCOUNTER — Emergency Department (HOSPITAL_COMMUNITY)
Admission: EM | Admit: 2014-07-29 | Discharge: 2014-07-29 | Disposition: A | Discharge status: Home / Self Care | Payer: Medicare Other | Attending: Emergency Medicine | Admitting: Emergency Medicine

## 2014-07-29 ENCOUNTER — Emergency Department (HOSPITAL_COMMUNITY): Payer: Medicare Other

## 2014-07-29 ENCOUNTER — Encounter (HOSPITAL_COMMUNITY): Payer: Self-pay | Admitting: *Deleted

## 2014-07-29 DIAGNOSIS — I10 Essential (primary) hypertension: Secondary | ICD-10-CM | POA: Diagnosis not present

## 2014-07-29 DIAGNOSIS — F329 Major depressive disorder, single episode, unspecified: Secondary | ICD-10-CM | POA: Insufficient documentation

## 2014-07-29 DIAGNOSIS — Y998 Other external cause status: Secondary | ICD-10-CM | POA: Diagnosis not present

## 2014-07-29 DIAGNOSIS — Y9389 Activity, other specified: Secondary | ICD-10-CM | POA: Insufficient documentation

## 2014-07-29 DIAGNOSIS — S4991XA Unspecified injury of right shoulder and upper arm, initial encounter: Secondary | ICD-10-CM | POA: Diagnosis present

## 2014-07-29 DIAGNOSIS — Z8739 Personal history of other diseases of the musculoskeletal system and connective tissue: Secondary | ICD-10-CM | POA: Insufficient documentation

## 2014-07-29 DIAGNOSIS — Z7982 Long term (current) use of aspirin: Secondary | ICD-10-CM | POA: Insufficient documentation

## 2014-07-29 DIAGNOSIS — I2510 Atherosclerotic heart disease of native coronary artery without angina pectoris: Secondary | ICD-10-CM | POA: Insufficient documentation

## 2014-07-29 DIAGNOSIS — Z79899 Other long term (current) drug therapy: Secondary | ICD-10-CM | POA: Insufficient documentation

## 2014-07-29 DIAGNOSIS — Z862 Personal history of diseases of the blood and blood-forming organs and certain disorders involving the immune mechanism: Secondary | ICD-10-CM | POA: Diagnosis not present

## 2014-07-29 DIAGNOSIS — Z8673 Personal history of transient ischemic attack (TIA), and cerebral infarction without residual deficits: Secondary | ICD-10-CM | POA: Insufficient documentation

## 2014-07-29 DIAGNOSIS — W050XXA Fall from non-moving wheelchair, initial encounter: Secondary | ICD-10-CM | POA: Diagnosis not present

## 2014-07-29 DIAGNOSIS — E669 Obesity, unspecified: Secondary | ICD-10-CM | POA: Diagnosis not present

## 2014-07-29 DIAGNOSIS — Z794 Long term (current) use of insulin: Secondary | ICD-10-CM | POA: Diagnosis not present

## 2014-07-29 DIAGNOSIS — M25511 Pain in right shoulder: Secondary | ICD-10-CM

## 2014-07-29 DIAGNOSIS — Z880 Allergy status to penicillin: Secondary | ICD-10-CM | POA: Diagnosis not present

## 2014-07-29 DIAGNOSIS — F0390 Unspecified dementia without behavioral disturbance: Secondary | ICD-10-CM | POA: Insufficient documentation

## 2014-07-29 DIAGNOSIS — W19XXXA Unspecified fall, initial encounter: Secondary | ICD-10-CM

## 2014-07-29 DIAGNOSIS — E119 Type 2 diabetes mellitus without complications: Secondary | ICD-10-CM | POA: Diagnosis not present

## 2014-07-29 DIAGNOSIS — Y92128 Other place in nursing home as the place of occurrence of the external cause: Secondary | ICD-10-CM | POA: Diagnosis not present

## 2014-07-29 LAB — BASIC METABOLIC PANEL
Anion gap: 7 (ref 5–15)
BUN: 19 mg/dL (ref 6–23)
CHLORIDE: 106 mmol/L (ref 96–112)
CO2: 30 mmol/L (ref 19–32)
Calcium: 8.8 mg/dL (ref 8.4–10.5)
Creatinine, Ser: 0.81 mg/dL (ref 0.50–1.10)
GFR calc Af Amer: 74 mL/min — ABNORMAL LOW (ref >90)
GFR, EST NON AFRICAN AMERICAN: 64 mL/min — AB (ref >90)
Glucose, Bld: 86 mg/dL (ref 70–99)
Potassium: 4 mmol/L (ref 3.5–5.1)
Sodium: 143 mmol/L (ref 135–145)

## 2014-07-29 LAB — CBC
HCT: 39.6 % (ref 36.0–46.0)
Hemoglobin: 12.6 g/dL (ref 12.0–15.0)
MCH: 30.7 pg (ref 26.0–34.0)
MCHC: 31.8 g/dL (ref 30.0–36.0)
MCV: 96.6 fL (ref 78.0–100.0)
Platelets: 95 10*3/uL — ABNORMAL LOW (ref 150–400)
RBC: 4.1 MIL/uL (ref 3.87–5.11)
RDW: 13.8 % (ref 11.5–15.5)
WBC: 8.9 10*3/uL (ref 4.0–10.5)

## 2014-07-29 NOTE — ED Provider Notes (Signed)
CSN: [id number]     Arrival date & time 07/29/14  0521 History   First MD Initiated Contact with Patient 07/29/14 0601     Chief Complaint  Patient presents with  . Fall     (Consider location/radiation/quality/duration/timing/severity/associated sxs/prior Treatment) HPI Comments: Patient with h/o vascular DM, previous strokes with deficits -- presents from [facility]. Reportedly slid out of wheelchair onto right side. C/o R arm and shoulder pain. No reported head or neck injury. No tx PTA. Level V caveat 2/2 dementia. Pt is DNR.   Patient is a 81 y.o. female presenting with fall. The history is provided by the nursing home. The history is limited by the condition of the patient.  Fall    Past Medical History  Diagnosis Date  . Coronary atherosclerosis of unspecified type of vessel, native or graft   . Depressive disorder, not elsewhere classified   . Type II or unspecified type diabetes mellitus with neurological manifestations, not stated as uncontrolled   . Other and unspecified hyperlipidemia   . Unspecified essential hypertension   . Occlusion and stenosis of carotid artery without mention of cerebral infarction   . Personal history of other diseases of circulatory system   . Unspecified cerebral artery occlusion with cerebral infarction   . Polymyalgia rheumatica   . Obesity, unspecified   . Leukocytosis, unspecified    Past Surgical History  Procedure Laterality Date  . Total abdominal hysterectomy    . Appendectomy    . Cataract surgery      bilateral w intraocular placement  . Refractive surgery      both eyes x6  . Posterior laminectomy / decompression cervical spine      w diskectomy and fusion of c5-c7 and anterior cervical plate  . Surgery of kidney stones      a few years ago   Family History  Problem Relation Age of Onset  . Coronary artery disease Other    History  Substance Use Topics  . Smoking status: Never Smoker   . Smokeless tobacco: Never Used   . Alcohol Use: No   OB History    No data available     Review of Systems  Unable to perform ROS: Dementia      Allergies  Benadryl; Codeine; Hydrocodone; Ibuprofen; Penicillins; and Sulfonamide derivatives  Home Medications   Prior to Admission medications   Medication Sig Start Date End Date Taking? Authorizing Provider  amLODipine (NORVASC) 10 MG tablet Take 10 mg by mouth every morning.    Yes Historical Provider, MD  aspirin 81 MG chewable tablet Chew 81 mg by mouth every morning.   Yes Historical Provider, MD  atenolol (TENORMIN) 25 MG tablet Take 25 mg by mouth every morning.    Yes Historical Provider, MD  bisacodyl (DULCOLAX) 10 MG suppository Place 10 mg rectally as needed for moderate constipation.   Yes Historical Provider, MD  cetirizine (ZYRTEC) 10 MG tablet Take 10 mg by mouth every morning.    Yes Historical Provider, MD  clonazePAM (KLONOPIN) 0.5 MG tablet Take one tablet by mouth twice daily 05/18/14  Yes Sharon Seller, NP  divalproex (DEPAKOTE SPRINKLE) 125 MG capsule Take 500 mg by mouth 3 (three) times daily.   Yes Historical Provider, MD  ferrous sulfate 325 (65 FE) MG tablet Take 325 mg by mouth daily with breakfast.     Yes Historical Provider, MD  gabapentin (NEURONTIN) 100 MG capsule Take 100 mg by mouth 2 (two) times daily.  Yes Historical Provider, MD  insulin detemir (LEVEMIR) 100 UNIT/ML injection Inject 28 Units into the skin daily.    Yes Historical Provider, MD  ipratropium-albuterol (DUONEB) 0.5-2.5 (3) MG/3ML SOLN Take 3 mLs by nebulization every 6 (six) hours as needed (shortness of breath).    Yes Historical Provider, MD  linagliptin (TRADJENTA) 5 MG TABS tablet Take 5 mg by mouth daily.   Yes Historical Provider, MD  lisinopril (PRINIVIL,ZESTRIL) 5 MG tablet Take 5 mg by mouth daily.   Yes Historical Provider, MD  LORazepam (ATIVAN) 2 MG/ML injection Give 0.225ml intramuscular every 6 hours as needed for agitation/aggressive behavior 03/03/14   Yes Kimber RelicArthur G Green, MD  magnesium hydroxide (MILK OF MAGNESIA) 400 MG/5ML suspension Take 30 mLs by mouth daily as needed for mild constipation.   Yes Historical Provider, MD  nitroGLYCERIN (NITROSTAT) 0.4 MG SL tablet Place 0.4 mg under the tongue every 5 (five) minutes as needed for chest pain.   Yes Historical Provider, MD  omeprazole (PRILOSEC) 20 MG capsule Take 20 mg by mouth daily.   Yes Historical Provider, MD  sucralfate (CARAFATE) 1 G tablet Take 1 g by mouth every 6 (six) hours.   Yes Historical Provider, MD   BP 153/56 mmHg  Pulse 66  Temp(Src) 98.3 F (36.8 C) (Oral)  Resp 15  Ht 5\' 4"  (1.626 m)  Wt 174 lb (78.926 kg)  BMI 29.85 kg/m2  SpO2 98%   Physical Exam  Constitutional: She appears well-developed and well-nourished.  HENT:  Head: Normocephalic and atraumatic. Head is without raccoon's eyes and without Battle's sign.  Right Ear: Tympanic membrane, external ear and ear canal normal. No hemotympanum.  Left Ear: Tympanic membrane, external ear and ear canal normal. No hemotympanum.  Nose: Nose normal. No nasal septal hematoma.  Mouth/Throat: Uvula is midline, oropharynx is clear and moist and mucous membranes are normal.  Eyes: Conjunctivae, EOM and lids are normal. Pupils are equal, round, and reactive to light. Right eye exhibits no nystagmus. Left eye exhibits no nystagmus.  No visible hyphema noted  Neck: Normal range of motion. Neck supple.  Cardiovascular: Normal rate and regular rhythm.   Pulmonary/Chest: Effort normal and breath sounds normal.  Abdominal: Soft. There is no tenderness.  Musculoskeletal:       Right shoulder: Normal. She exhibits normal range of motion, no tenderness and no bony tenderness.       Left shoulder: Normal.       Right elbow: Normal.She exhibits normal range of motion and no swelling.       Left elbow: Normal.       Right wrist: Normal.       Left wrist: Normal.       Right hip: Normal.       Left hip: Normal.       Right knee:  Normal.       Left knee: Normal. No tenderness found.       Right ankle: Normal.       Left ankle: Normal.       Cervical back: She exhibits normal range of motion, no tenderness and no bony tenderness.       Thoracic back: She exhibits no tenderness and no bony tenderness.       Lumbar back: She exhibits no tenderness and no bony tenderness.       Right upper arm: Normal.       Right forearm: Normal.       Arms:  Right hand: Normal.       Legs: Neurological: She is alert. She has normal strength. No sensory deficit. She exhibits normal muscle tone. GCS eye subscore is 4. GCS verbal subscore is 5. GCS motor subscore is 6.  Unable to cooperate with full neuro exam 2/2 dementia. Patient is minimally verbal.   Skin: Skin is warm and dry.  Psychiatric: She has a normal mood and affect.  Nursing note and vitals reviewed.   ED Course  Procedures (including critical care time) Labs Review Labs Reviewed  BASIC METABOLIC PANEL - Abnormal; Notable for the following:    GFR calc non Af Amer 64 (*)    GFR calc Af Amer 74 (*)    All other components within normal limits  CBC    Imaging Review Dg Shoulder Right  07/29/2014   CLINICAL DATA:  Acute onset of right shoulder pain and neck pain, status post fall. Initial encounter.  EXAM: RIGHT SHOULDER - 2+ VIEW  COMPARISON:  Right shoulder radiographs performed 09/29/2013  FINDINGS: There is no evidence of fracture or dislocation. The right humeral head is seated within the glenoid fossa. The acromioclavicular joint is unremarkable in appearance. No significant soft tissue abnormalities are seen. The visualized portions of the right lung are clear. Cervical spinal fusion hardware is noted.  IMPRESSION: No evidence of fracture or dislocation.   Electronically Signed   By: Roanna Raider M.D.   On: 07/29/2014 06:03     EKG Interpretation None       6:14 AM Patient seen and examined. X-ray reviewed. No obvious severe injury on exam. CBC  ordered by staff prior to me seeing patient. I added BMP given DM.    Vital signs reviewed and are as follows: BP 153/56 mmHg  Pulse 66  Temp(Src) 98.3 F (36.8 C) (Oral)  Resp 15  Ht  (1.626 m)  Wt 174 lb (78.926 kg)  BMI 29.85 kg/m2  SpO2 98%   Discussed with Dr. Rhunette Croft, who will see. Pending labs. X-ray neg.   7:01 AM Labs are unremarkable. Patient has remained stable in ED. She uses a wheelchair to ambulate. Will d/c to home.   MDM   Final diagnoses:  Fall, initial encounter  Right shoulder pain   Patient with reported fall from sitting, no head or neck injury reported and no obvious injury on exam. Can range all joints without significant discomfort exhibited. No external abdominal or chest injury. Abd is soft, NT. Shoulder imaging is negative. Will d/c to home.     Renne Crigler, PA-C 07/29/14 [id number]  Derwood Kaplan, MD 07/29/14 [phone]

## 2014-07-29 NOTE — ED Notes (Signed)
Pt. Is from [facility] and slid out of wheelchair hit the floor c/o right arm and shoulder pain. Pt. Did not hit head no LOC.

## 2014-07-29 NOTE — Discharge Instructions (Signed)
Please read and follow all provided instructions.  Your diagnoses today include:  1. Fall, initial encounter   2. Right shoulder pain    Tests performed today include:  An x-ray of the affected area (right shoulder) - does NOT show any broken bones or dislocations  Blood counts and electrolytes - normal  Vital signs. See below for your results today.   Medications prescribed:   None  Take any prescribed medications only as directed.  Home care instructions:   Follow any educational materials contained in this packet  Follow R.I.C.E. Protocol:  R - rest your injury   I  - use ice on injury without applying directly to skin  C - compress injury with bandage or splint  E - elevate the injury as much as possible  Follow-up instructions: Please follow-up with your primary care provider if you continue to have significant pain in 1 week. In this case you may have a more severe injury that requires further care.   Return instructions:   Please return if your fingers are numb or tingling, appear gray or blue, or you have severe pain (also elevate the arm and loosen splint or wrap if you were given one)  Please return to the Emergency Department if you experience worsening symptoms.   Please return if you have any other emergent concerns.  Additional Information:  Your vital signs today were: BP 153/56 mmHg   Pulse 66   Temp(Src) 98.3 F (36.8 C) (Oral)   Resp 15   Ht 5\' 4"  (1.626 m)   Wt 174 lb (78.926 kg)   BMI 29.85 kg/m2   SpO2 98% If your blood pressure (BP) was elevated above 135/85 this visit, please have this repeated by your doctor within one month. --------------

## 2014-08-11 LAB — BASIC METABOLIC PANEL
BUN: 25 mg/dL — AB (ref 4–21)
Glucose: 85 mg/dL
POTASSIUM: 4.4 mmol/L (ref 3.4–5.3)
Sodium: 148 mmol/L — AB (ref 137–147)

## 2014-08-11 LAB — HEPATIC FUNCTION PANEL
ALT: 12 U/L (ref 7–35)
AST: 31 U/L (ref 13–35)
Bilirubin, Total: 0.3 mg/dL

## 2014-08-31 ENCOUNTER — Encounter: Payer: Self-pay | Admitting: *Deleted

## 2014-09-08 ENCOUNTER — Encounter: Payer: Self-pay | Admitting: Internal Medicine

## 2014-09-08 ENCOUNTER — Non-Acute Institutional Stay (SKILLED_NURSING_FACILITY): Payer: Medicare Other | Admitting: Internal Medicine

## 2014-09-08 DIAGNOSIS — F0391 Unspecified dementia with behavioral disturbance: Secondary | ICD-10-CM

## 2014-09-08 DIAGNOSIS — F312 Bipolar disorder, current episode manic severe with psychotic features: Secondary | ICD-10-CM | POA: Diagnosis not present

## 2014-09-08 DIAGNOSIS — F03918 Unspecified dementia, unspecified severity, with other behavioral disturbance: Secondary | ICD-10-CM

## 2014-09-08 DIAGNOSIS — R296 Repeated falls: Secondary | ICD-10-CM | POA: Diagnosis not present

## 2014-09-08 DIAGNOSIS — F0151 Vascular dementia with behavioral disturbance: Secondary | ICD-10-CM | POA: Diagnosis not present

## 2014-09-08 DIAGNOSIS — F01518 Vascular dementia, unspecified severity, with other behavioral disturbance: Secondary | ICD-10-CM

## 2014-09-08 NOTE — Progress Notes (Signed)
MRN: [id number] Name: [name] [name]  Sex: female Age: 81 y.o. DOB: 12/07/1926  [name] Level Of Care: SNF Provider: Merrilee SeashoreALEXANDER, Loyalty Brashier D Emergency Contacts: Extended Emergency Contact Information Primary Emergency Contact: [name],[name] Address: [address]          [place], [place] [address] [place] Home Phone: [phone] Mobile Phone: [phone] Relation: Daughter Secondary Emergency Contact: Ileana Laddalhoun,Therman Address: [address]          [place], [place] [address] [place] Home Phone: [phone] Mobile Phone: [phone] Relation: Son  Code Status: DNR  Allergies: Benadryl; Codeine; Hydrocodone; Ibuprofen; Penicillins; and Sulfonamide derivatives  Chief Complaint  Patient presents with  . Medical Management of Chronic Issues    HPI: Patient is 81 y.o. female who is being seen for routine issues.  Past Medical History  Diagnosis Date  . Coronary atherosclerosis of unspecified type of vessel, native or graft   . Depressive disorder, not elsewhere classified   . Type II or unspecified type diabetes mellitus with neurological manifestations, not stated as uncontrolled   . Other and unspecified hyperlipidemia   . Unspecified essential hypertension   . Occlusion and stenosis of carotid artery without mention of cerebral infarction   . Personal history of other diseases of circulatory system   . Unspecified cerebral artery occlusion with cerebral infarction   . Polymyalgia rheumatica   . Obesity, unspecified   . Leukocytosis, unspecified     Past Surgical History  Procedure Laterality Date  . Total abdominal hysterectomy    . Appendectomy    . Cataract surgery      bilateral w intraocular placement  . Refractive surgery      both eyes x6  . Posterior laminectomy / decompression cervical spine      w diskectomy and fusion of c5-c7 and anterior cervical plate  . Surgery of kidney stones      a few  years ago      Medication List       This list is accurate as of: 09/08/14 11:59 PM.  Always use your most recent med list.               amLODipine 10 MG tablet  Commonly known as:  NORVASC  Take 10 mg by mouth every morning.     aspirin 81 MG chewable tablet  Chew 81 mg by mouth every morning.     atenolol 25 MG tablet  Commonly known as:  TENORMIN  Take 25 mg by mouth every morning.     bisacodyl 10 MG suppository  Commonly known as:  DULCOLAX  Place 10 mg rectally as needed for moderate constipation.     cetirizine 10 MG tablet  Commonly known as:  ZYRTEC  Take 10 mg by mouth every morning.     clonazePAM 0.5 MG tablet  Commonly known as:  KLONOPIN  Take one tablet by mouth twice daily     divalproex 125 MG capsule  Commonly known as:  DEPAKOTE SPRINKLE  Take 500 mg by mouth 3 (three) times daily.     ferrous sulfate 325 (65 FE) MG tablet  Take 325 mg by mouth daily with breakfast.     gabapentin 100 MG capsule  Commonly known as:  NEURONTIN  Take 100 mg by mouth 2 (two) times daily.     insulin detemir 100 UNIT/ML injection  Commonly known as:  LEVEMIR  Inject 28 Units into the skin every morning.  ipratropium-albuterol 0.5-2.5 (3) MG/3ML Soln  Commonly known as:  DUONEB  Take 3 mLs by nebulization every 6 (six) hours as needed (shortness of breath).     linagliptin 5 MG Tabs tablet  Commonly known as:  TRADJENTA  Take 5 mg by mouth every morning. For diabetes     lisinopril 5 MG tablet  Commonly known as:  PRINIVIL,ZESTRIL  Take 5 mg by mouth daily.     LORazepam 2 MG/ML injection  Commonly known as:  ATIVAN  Give 0.105ml intramuscular every 6 hours as needed for agitation/aggressive behavior     magnesium hydroxide 400 MG/5ML suspension  Commonly known as:  MILK OF MAGNESIA  Take 30 mLs by mouth daily as needed for mild constipation.     nitroGLYCERIN 0.4 MG SL tablet  Commonly known as:  NITROSTAT  Place 0.4 mg under the tongue every 5  (five) minutes as needed for chest pain.     OLANZapine zydis 5 MG disintegrating tablet  Commonly known as:  ZYPREXA  5 mg at bedtime.     omeprazole 20 MG capsule  Commonly known as:  PRILOSEC  Take 20 mg by mouth daily.     QUEtiapine 50 MG tablet  Commonly known as:  SEROQUEL  Take 50 mg by mouth 2 (two) times daily. For psychosis     sucralfate 1 G tablet  Commonly known as:  CARAFATE  Take 1 g by mouth every 6 (six) hours.        No orders of the defined types were placed in this encounter.    Immunization History  Administered Date(s) Administered  . Influenza Whole 01/30/2013  . Influenza-Unspecified 01/30/2013  . PPD Test 07/27/2008    History  Substance Use Topics  . Smoking status: Never Smoker   . Smokeless tobacco: Never Used  . Alcohol Use: No    Review of Systems  DATA OBTAINED: from  nurse, medical record GENERAL:  no fevers, fatigue, appetite changes SKIN: No itching, rash HEENT: No complaint RESPIRATORY: No cough, wheezing, SOB CARDIAC: No chest pain, palpitations, lower extremity edema  GI: No abdominal pain, No N/V/D or constipation, No heartburn or reflux  GU: No dysuria, frequency or urgency, or incontinence  MUSCULOSKELETAL: No unrelieved bone/joint pain NEUROLOGIC: No headache, dizziness, inc falls not resulting in injury PSYCHIATRIC: No overt anxiety or sadness;inc behavoirs, aggitation and psychosis  Filed Vitals:   09/08/14 2030  BP: 154/71  Pulse: 72  Temp: 97.5 F (36.4 C)  Resp: 18    Physical Exam  GENERAL APPEARANCE: Alert, conversant, No acute distress , elderly WF sifting through drawers SKIN: No diaphoresis rash HEENT: Unremarkable RESPIRATORY: Breathing is even, unlabored. Lung sounds are clear   CARDIOVASCULAR: Heart RRR no murmurs, rubs or gallops. No peripheral edema  GASTROINTESTINAL: Abdomen is soft, non-tender, not distended w/ normal bowel sounds.  GENITOURINARY: Bladder non tender, not distended   MUSCULOSKELETAL: No abnormal joints or musculature NEUROLOGIC: Cranial nerves 2-12 grossly intact PSYCHIATRIC: confused but pleasant  Patient Active Problem List   Diagnosis Date Noted  . Psychosis in elderly with behavioral disturbance 09/13/2014  . Falls frequently 09/13/2014  . Diastolic CHF, chronic 06/05/2014  . DM (diabetes mellitus), type 2 with complications 06/05/2014  . GERD (gastroesophageal reflux disease) 06/05/2014  . Bipolar disorder 05/05/2014  . CKD (chronic kidney disease) stage 3, GFR 30-59 ml/min 02/13/2014  . Aggression 12/27/2013  . Vascular dementia with behavioral disturbance 12/27/2013  . Dyspnea 11/17/2010  . Peripheral autonomic neuropathy due to  DM 07/31/2008  . Hyperlipidemia LDL goal <70 07/31/2008  . OBESITY 07/31/2008  . LEUKOCYTOSIS 07/31/2008  . DEPRESSION 07/31/2008  . Essential hypertension 07/31/2008  . Coronary atherosclerosis 07/31/2008  . CAROTID ARTERY STENOSIS 07/31/2008  . CVA 07/31/2008  . POLYMYALGIA RHEUMATICA 07/31/2008  . DIABETES MELLITUS, TYPE II, HX OF 07/31/2008  . ANEURYSM, HX OF 07/31/2008    CBC    Component Value Date/Time   WBC 8.9 07/29/2014 0612   RBC 4.10 07/29/2014 0612   HGB 12.6 07/29/2014 0612   HCT 39.6 07/29/2014 0612   PLT 95* 07/29/2014 0612   MCV 96.6 07/29/2014 0612   LYMPHSABS 4.2* 12/26/2013 2052   MONOABS 1.1* 12/26/2013 2052   EOSABS 0.1 12/26/2013 2052   BASOSABS 0.0 12/26/2013 2052    CMP     Component Value Date/Time   NA 148* 08/11/2014   NA 143 07/29/2014 0612   K 4.4 08/11/2014   CL 106 07/29/2014 0612   CO2 30 07/29/2014 0612   GLUCOSE 86 07/29/2014 0612   BUN 25* 08/11/2014   BUN 19 07/29/2014 0612   CREATININE 0.81 07/29/2014 0612   CALCIUM 8.8 07/29/2014 0612   PROT 7.5 12/26/2013 2052   ALBUMIN 3.8 12/26/2013 2052   AST 31 08/11/2014   ALT 12 08/11/2014   ALKPHOS 83 12/26/2013 2052   BILITOT 0.4 12/26/2013 2052   GFRNONAA 64* 07/29/2014 0612   GFRAA 74* 07/29/2014  0612    Assessment and Plan  Psychosis in elderly with behavioral disturbance Increased behavoirs in April with PNA and UTI requiring re-eval by Psych. Zypreza 5 mg q HS added to prn zyprexa IM, seroquel 50 mg BID, haldol 0.5 mg HS.   Bipolar disorder Chronic, with exacerbations of behavoir felt to be 2/2 infections, PNA and UTI, treated. Depakote inc to  TID, seroquel stays at 50 mg BID, Klonopin 0.5 mg BID and prn ativan 1 mg IM q6.   Vascular dementia with behavioral disturbance Progressive with exacerbation of behavoirs with med changes as per psychosi and Bipolar.   Falls frequently Chronic and ongoing with pt frequently sliding herself out of chair and bed.facility is taking precaustions, will cont monitor     Margit Hanks, MD

## 2014-09-13 ENCOUNTER — Encounter: Payer: Self-pay | Admitting: Internal Medicine

## 2014-09-13 DIAGNOSIS — R296 Repeated falls: Secondary | ICD-10-CM | POA: Insufficient documentation

## 2014-09-13 DIAGNOSIS — F0391 Unspecified dementia with behavioral disturbance: Secondary | ICD-10-CM | POA: Insufficient documentation

## 2014-09-13 DIAGNOSIS — F03918 Unspecified dementia, unspecified severity, with other behavioral disturbance: Secondary | ICD-10-CM | POA: Insufficient documentation

## 2014-09-13 NOTE — Assessment & Plan Note (Signed)
Chronic and ongoing with pt frequently sliding herself out of chair and bed.facility is taking precaustions, will cont monitor

## 2014-09-13 NOTE — Assessment & Plan Note (Signed)
Increased behavoirs in April with PNA and UTI requiring re-eval by Psych. Zypreza 5 mg q HS added to prn zyprexa IM, seroquel 50 mg BID, haldol 0.5 mg HS.

## 2014-09-13 NOTE — Assessment & Plan Note (Signed)
Chronic, with exacerbations of behavoir felt to be 2/2 infections, PNA and UTI, treated. Depakote inc to 500mg  TID, seroquel stays at 50 mg BID, Klonopin 0.5 mg BID and prn ativan 1 mg IM q6.

## 2014-09-13 NOTE — Assessment & Plan Note (Signed)
Progressive with exacerbation of behavoirs with med changes as per psychosi and Bipolar.

## 2014-10-17 ENCOUNTER — Non-Acute Institutional Stay (SKILLED_NURSING_FACILITY): Payer: Medicare Other | Admitting: Internal Medicine

## 2014-10-17 ENCOUNTER — Encounter: Payer: Self-pay | Admitting: Internal Medicine

## 2014-10-17 DIAGNOSIS — I5032 Chronic diastolic (congestive) heart failure: Secondary | ICD-10-CM | POA: Diagnosis not present

## 2014-10-17 DIAGNOSIS — I25119 Atherosclerotic heart disease of native coronary artery with unspecified angina pectoris: Secondary | ICD-10-CM

## 2014-10-17 DIAGNOSIS — N183 Chronic kidney disease, stage 3 unspecified: Secondary | ICD-10-CM

## 2014-10-17 DIAGNOSIS — I110 Hypertensive heart disease with heart failure: Secondary | ICD-10-CM | POA: Diagnosis not present

## 2014-10-17 DIAGNOSIS — I509 Heart failure, unspecified: Secondary | ICD-10-CM | POA: Diagnosis not present

## 2014-10-17 NOTE — Assessment & Plan Note (Signed)
Chronic and stable without exacerbation of edemal Plan - cont atenolol, ACE; pt not on lasix

## 2014-10-17 NOTE — Progress Notes (Signed)
MRN: [id number] Name: [name] [name]  Sex: female Age: 81 y.o. DOB: Mar 17, 1927  [facility]: [name] Facility/Room: Level Of Care: SNF Provider: Merrilee Seashore D Emergency Contacts: Extended Emergency Contact Information Primary Emergency Contact: [name],[name] Address: [address]          [place], [place] [address] [name] of [place] Home Phone: [phone] Mobile Phone: [phone] Relation: Daughter Secondary Emergency Contact: [name] Address: [address], [place] [address] [name] of [place] Home Phone: [phone] Mobile Phone: [phone] Relation: Son  Code Status: DNR  Allergies: Benadryl; Codeine; Hydrocodone; Ibuprofen; Penicillins; and Sulfonamide derivatives  Chief Complaint  Patient presents with  . Medical Management of Chronic Issues    HPI: Patient is 81 y.o. female who is being seen for routine issues.  Past Medical History  Diagnosis Date  . Coronary atherosclerosis of unspecified type of vessel, native or graft   . Depressive disorder, not elsewhere classified   . Type II or unspecified type diabetes mellitus with neurological manifestations, not stated as uncontrolled   . Other and unspecified hyperlipidemia   . Unspecified essential hypertension   . Occlusion and stenosis of carotid artery without mention of cerebral infarction   . Personal history of other diseases of circulatory system   . Unspecified cerebral artery occlusion with cerebral infarction   . Polymyalgia rheumatica   . Obesity, unspecified   . Leukocytosis, unspecified     Past Surgical History  Procedure Laterality Date  . Total abdominal hysterectomy    . Appendectomy    . Cataract surgery      bilateral w intraocular placement  . Refractive surgery      both eyes x6  . Posterior laminectomy / decompression cervical spine      w diskectomy and fusion of c5-c7 and anterior cervical plate  . Surgery of kidney stones      a few  years ago      Medication List       This list is accurate as of: 10/17/14  2:07 PM.  Always use your most recent med list.               amLODipine 10 MG tablet  Commonly known as:  NORVASC  Take 10 mg by mouth every morning.     aspirin 81 MG chewable tablet  Chew 81 mg by mouth every morning.     atenolol 25 MG tablet  Commonly known as:  TENORMIN  Take 25 mg by mouth every morning.     bisacodyl 10 MG suppository  Commonly known as:  DULCOLAX  Place 10 mg rectally as needed for moderate constipation.     cetirizine 10 MG tablet  Commonly known as:  ZYRTEC  Take 10 mg by mouth every morning.     clonazePAM 0.5 MG tablet  Commonly known as:  KLONOPIN  Take one tablet by mouth twice daily     divalproex 125 MG capsule  Commonly known as:  DEPAKOTE SPRINKLE  Take 500 mg by mouth 3 (three) times daily.     ferrous sulfate 325 (65 FE) MG tablet  Take 325 mg by mouth daily with breakfast.     gabapentin 100 MG capsule  Commonly known as:  NEURONTIN  Take 100 mg by mouth 2 (two) times daily.     insulin detemir 100 UNIT/ML injection  Commonly known as:  LEVEMIR  Inject 28 Units into the skin every morning.  ipratropium-albuterol 0.5-2.5 (3) MG/3ML Soln  Commonly known as:  DUONEB  Take 3 mLs by nebulization every 6 (six) hours as needed (shortness of breath).     linagliptin 5 MG Tabs tablet  Commonly known as:  TRADJENTA  Take 5 mg by mouth every morning. For diabetes     lisinopril 5 MG tablet  Commonly known as:  PRINIVIL,ZESTRIL  Take 5 mg by mouth daily.     LORazepam 2 MG/ML injection  Commonly known as:  ATIVAN  Give 0.27ml intramuscular every 6 hours as needed for agitation/aggressive behavior     magnesium hydroxide 400 MG/5ML suspension  Commonly known as:  MILK OF MAGNESIA  Take 30 mLs by mouth daily as needed for mild constipation.     nitroGLYCERIN 0.4 MG SL tablet  Commonly known as:  NITROSTAT  Place 0.4 mg under the tongue every 5  (five) minutes as needed for chest pain.     OLANZapine zydis 5 MG disintegrating tablet  Commonly known as:  ZYPREXA  5 mg at bedtime.     omeprazole 20 MG capsule  Commonly known as:  PRILOSEC  Take 20 mg by mouth daily.     QUEtiapine 50 MG tablet  Commonly known as:  SEROQUEL  Take 50 mg by mouth 2 (two) times daily. For psychosis     sucralfate 1 G tablet  Commonly known as:  CARAFATE  Take 1 g by mouth every 6 (six) hours.        No orders of the defined types were placed in this encounter.    Immunization History  Administered Date(s) Administered  . Influenza Whole 01/30/2013  . Influenza-Unspecified 01/30/2013  . PPD Test 07/27/2008    History  Substance Use Topics  . Smoking status: Never Smoker   . Smokeless tobacco: Never Used  . Alcohol Use: No    Review of Systems  DATA OBTAINED: from nurse, medical record GENERAL:  no fevers, fatigue, appetite changes SKIN: No itching, rash HEENT: No complaint RESPIRATORY: No cough, wheezing, SOB CARDIAC: No chest pain, palpitations, lower extremity edema  GI: No abdominal pain, No N/V/D or constipation, No heartburn or reflux  GU: No dysuria, frequency or urgency, or incontinence  MUSCULOSKELETAL: No unrelieved bone/joint pain NEUROLOGIC: No headache, dizziness  PSYCHIATRIC: No overt anxiety or sadness  Filed Vitals:   10/17/14 1320  BP: 121/65  Pulse: 75  Temp: 96.9 F (36.1 C)  Resp: 18    Physical Exam  GENERAL APPEARANCE: Alert, conversant, WF,No acute distress  SKIN: No diaphoresis rash HEENT: Unremarkable RESPIRATORY: Breathing is even, unlabored. Lung sounds are clear   CARDIOVASCULAR: Heart RRR no murmurs, rubs or gallops. No peripheral edema  GASTROINTESTINAL: Abdomen is soft, non-tender, not distended w/ normal bowel sounds.  GENITOURINARY: Bladder non tender, not distended  MUSCULOSKELETAL: No abnormal joints or musculature NEUROLOGIC: Cranial nerves 2-12 grossly intact.  PSYCHIATRIC:  confused, no behavioral issues  Patient Active Problem List   Diagnosis Date Noted  . Psychosis in elderly with behavioral disturbance 09/13/2014  . Falls frequently 09/13/2014  . Diastolic CHF, chronic 06/05/2014  . DM (diabetes mellitus), type 2 with complications 06/05/2014  . GERD (gastroesophageal reflux disease) 06/05/2014  . Bipolar disorder 05/05/2014  . CKD (chronic kidney disease) stage 3, GFR 30-59 ml/min 02/13/2014  . Aggression 12/27/2013  . Vascular dementia with behavioral disturbance 12/27/2013  . Dyspnea 11/17/2010  . Peripheral autonomic neuropathy due to DM 07/31/2008  . Hyperlipidemia LDL goal <70 07/31/2008  . OBESITY 07/31/2008  .  LEUKOCYTOSIS 07/31/2008  . DEPRESSION 07/31/2008  . Hypertensive heart disease with CHF 07/31/2008  . Coronary atherosclerosis 07/31/2008  . CAROTID ARTERY STENOSIS 07/31/2008  . CVA 07/31/2008  . POLYMYALGIA RHEUMATICA 07/31/2008  . DIABETES MELLITUS, TYPE II, HX OF 07/31/2008  . ANEURYSM, HX OF 07/31/2008    CBC    Component Value Date/Time   WBC 8.9 07/29/2014 0612   RBC 4.10 07/29/2014 0612   HGB 12.6 07/29/2014 0612   HCT 39.6 07/29/2014 0612   PLT 95* 07/29/2014 0612   MCV 96.6 07/29/2014 0612   LYMPHSABS 4.2* 12/26/2013 2052   MONOABS 1.1* 12/26/2013 2052   EOSABS 0.1 12/26/2013 2052   BASOSABS 0.0 12/26/2013 2052    CMP     Component Value Date/Time   NA 148* 08/11/2014   NA 143 07/29/2014 0612   K 4.4 08/11/2014   CL 106 07/29/2014 0612   CO2 30 07/29/2014 0612   GLUCOSE 86 07/29/2014 0612   BUN 25* 08/11/2014   BUN 19 07/29/2014 0612   CREATININE 0.81 07/29/2014 0612   CALCIUM 8.8 07/29/2014 0612   PROT 7.5 12/26/2013 2052   ALBUMIN 3.8 12/26/2013 2052   AST 31 08/11/2014   ALT 12 08/11/2014   ALKPHOS 83 12/26/2013 2052   BILITOT 0.4 12/26/2013 2052   GFRNONAA 64* 07/29/2014 0612   GFRAA 74* 07/29/2014 0612    Assessment and Plan  Coronary atherosclerosis S/p stent placement, not a  candidate for future invasive procedures;no recent problems; Plan- continue ASA, Imdur, atenolol  Hypertensive heart disease with CHF Chronic and stable and controlled on current regimen;Plan- cont norvasc 10, atenolol 25 daily and Lisinopril 5 mg and Imdur  Diastolic CHF, chronic Chronic and stable without exacerbation of edemal Plan - cont atenolol, ACE; pt not on lasix   CKD (chronic kidney disease) stage 3, GFR 30-59 ml/min In 08/2014 GFR 77, CrCl - 55; stable weight, no declines;pt is on Lisinopril 5 mg    Margit Hanks, MD

## 2014-10-17 NOTE — Assessment & Plan Note (Signed)
In 08/2014 GFR 77, CrCl - 55; stable weight, no declines;pt is on Lisinopril 5 mg

## 2014-10-17 NOTE — Assessment & Plan Note (Signed)
Chronic and stable and controlled on current regimen;Plan- cont norvasc 10, atenolol 25 daily and Lisinopril 5 mg and Imdur

## 2014-10-17 NOTE — Assessment & Plan Note (Signed)
S/p stent placement, not a candidate for future invasive procedures;no recent problems; Plan- continue ASA, Imdur, atenolol

## 2014-11-20 ENCOUNTER — Non-Acute Institutional Stay (SKILLED_NURSING_FACILITY): Payer: Medicare Other | Admitting: Internal Medicine

## 2014-11-20 ENCOUNTER — Encounter: Payer: Self-pay | Admitting: Internal Medicine

## 2014-11-20 DIAGNOSIS — J309 Allergic rhinitis, unspecified: Secondary | ICD-10-CM

## 2014-11-20 DIAGNOSIS — K219 Gastro-esophageal reflux disease without esophagitis: Secondary | ICD-10-CM

## 2014-11-20 DIAGNOSIS — E785 Hyperlipidemia, unspecified: Secondary | ICD-10-CM

## 2014-11-20 NOTE — Assessment & Plan Note (Signed)
Pt had been onlow dose statin, decided to d/c 2/2 age and to decrease number of pills taken; Plan d/c statin and repeat FLP in several months

## 2014-11-20 NOTE — Progress Notes (Signed)
[name] Name: [name] [name]  Sex: female Age: 81 y.o. DOB: 1926/07/04  [facility] Facility/Room: Level Of Care: SNF Provider: Merrilee Seashore D Emergency Contacts: Extended Emergency Contact Information Primary Emergency Contact: [name],[name] Address: [address]          [place], [place] [address] [name] of [place] Home Phone: [phone] Mobile Phone: [phone] Relation: Daughter Secondary Emergency Contact: [name] Address: [address] [place] [address] [name] of [place] Home Phone: [phone] Mobile Phone: [phone] Relation: Son  Code Status: DNR  Allergies: Benadryl; Codeine; Hydrocodone; Ibuprofen; Penicillins; and Sulfonamide derivatives  Chief Complaint  Patient presents with  . Medical Management of Chronic Issues    HPI: Patient is 81 y.o. female who is being seen for routine issues of hyperlipidemia, GERD and AR.  Past Medical History  Diagnosis Date  . Coronary atherosclerosis of unspecified type of vessel, native or graft   . Depressive disorder, not elsewhere classified   . Type II or unspecified type diabetes mellitus with neurological manifestations, not stated as uncontrolled   . Other and unspecified hyperlipidemia   . Unspecified essential hypertension   . Occlusion and stenosis of carotid artery without mention of cerebral infarction   . Personal history of other diseases of circulatory system   . Unspecified cerebral artery occlusion with cerebral infarction   . Polymyalgia rheumatica   . Obesity, unspecified   . Leukocytosis, unspecified     Past Surgical History  Procedure Laterality Date  . Total abdominal hysterectomy    . Appendectomy    . Cataract surgery      bilateral w intraocular placement  . Refractive surgery      both eyes x6  . Posterior laminectomy / decompression cervical spine      w diskectomy and fusion of c5-c7 and anterior cervical plate  .  Surgery of kidney stones      a few years ago      Medication List       This list is accurate as of: 11/20/14  9:27 PM.  Always use your most recent med list.               amLODipine 10 MG tablet  Commonly known as:  NORVASC  Take 10 mg by mouth every morning.     aspirin 81 MG chewable tablet  Chew 81 mg by mouth every morning.     atenolol 25 MG tablet  Commonly known as:  TENORMIN  Take 25 mg by mouth every morning.     bisacodyl 10 MG suppository  Commonly known as:  DULCOLAX  Place 10 mg rectally as needed for moderate constipation.     cetirizine 10 MG tablet  Commonly known as:  ZYRTEC  Take 10 mg by mouth every morning.     clonazePAM 0.5 MG tablet  Commonly known as:  KLONOPIN  Take one tablet by mouth twice daily     divalproex 125 MG capsule  Commonly known as:  DEPAKOTE SPRINKLE  Take 500 mg by mouth 3 (three) times daily.     ferrous sulfate 325 (65 FE) MG tablet  Take 325 mg by mouth daily with breakfast.     gabapentin 100 MG capsule  Commonly known as:  NEURONTIN  Take 100 mg by mouth 2 (two) times daily.     insulin detemir 100 UNIT/ML injection  Commonly known as:  LEVEMIR  Inject 28 Units  into the skin every morning.     ipratropium-albuterol 0.5-2.5 (3) MG/3ML Soln  Commonly known as:  DUONEB  Take 3 mLs by nebulization every 6 (six) hours as needed (shortness of breath).     linagliptin 5 MG Tabs tablet  Commonly known as:  TRADJENTA  Take 5 mg by mouth every morning. For diabetes     lisinopril 5 MG tablet  Commonly known as:  PRINIVIL,ZESTRIL  Take 5 mg by mouth daily.     LORazepam 2 MG/ML injection  Commonly known as:  ATIVAN  Give 0.34ml intramuscular every 6 hours as needed for agitation/aggressive behavior     magnesium hydroxide 400 MG/5ML suspension  Commonly known as:  MILK OF MAGNESIA  Take 30 mLs by mouth daily as needed for mild constipation.     nitroGLYCERIN 0.4 MG SL tablet  Commonly known as:  NITROSTAT   Place 0.4 mg under the tongue every 5 (five) minutes as needed for chest pain.     OLANZapine zydis 5 MG disintegrating tablet  Commonly known as:  ZYPREXA  5 mg at bedtime.     omeprazole 20 MG capsule  Commonly known as:  PRILOSEC  Take 20 mg by mouth daily.     QUEtiapine 50 MG tablet  Commonly known as:  SEROQUEL  Take 50 mg by mouth 2 (two) times daily. For psychosis     sucralfate 1 G tablet  Commonly known as:  CARAFATE  Take 1 g by mouth every 6 (six) hours.        No orders of the defined types were placed in this encounter.    Immunization History  Administered Date(s) Administered  . Influenza Whole 01/30/2013  . Influenza-Unspecified 01/30/2013  . PPD Test 07/27/2008    History  Substance Use Topics  . Smoking status: Never Smoker   . Smokeless tobacco: Never Used  . Alcohol Use: No    Review of Systems  DATA OBTAINED: from nurse-pt is chronically confused GENERAL:  no fevers, fatigue, appetite changes SKIN: No itching, rash HEENT: No complaint RESPIRATORY: No cough, wheezing, SOB CARDIAC: No chest pain, palpitations, lower extremity edema  GI: No abdominal pain, No N/V/D or constipation, No heartburn or reflux  GU: No dysuria, frequency or urgency, or incontinence  MUSCULOSKELETAL: No unrelieved bone/joint pain NEUROLOGIC: No headache, dizziness  PSYCHIATRIC: No overt anxiety or sadness  Filed Vitals:   11/20/14 2058  BP: 121/65  Pulse: 71  Temp: 96.9 F (36.1 C)  Resp: 16    Physical Exam  GENERAL APPEARANCE: Alert, conversant,WF No acute distress  SKIN: No diaphoresis rash HEENT: Unremarkable RESPIRATORY: Breathing is even, unlabored. Lung sounds are clear   CARDIOVASCULAR: Heart RRR no murmurs, rubs or gallops. No peripheral edema  GASTROINTESTINAL: Abdomen is soft, non-tender, not distended w/ normal bowel sounds.  GENITOURINARY: Bladder non tender, not distended  MUSCULOSKELETAL: No abnormal joints or musculature NEUROLOGIC:  Cranial nerves 2-12 grossly intact PSYCHIATRIC: chronically confused no behavioral issues  Patient Active Problem List   Diagnosis Date Noted  . Allergic rhinitis 11/20/2014  . Psychosis in elderly with behavioral disturbance 09/13/2014  . Falls frequently 09/13/2014  . Diastolic CHF, chronic 06/05/2014  . DM (diabetes mellitus), type 2 with complications 06/05/2014  . GERD (gastroesophageal reflux disease) 06/05/2014  . Bipolar disorder 05/05/2014  . CKD (chronic kidney disease) stage 3, GFR 30-59 ml/min 02/13/2014  . Aggression 12/27/2013  . Vascular dementia with behavioral disturbance 12/27/2013  . Dyspnea 11/17/2010  . Peripheral autonomic neuropathy  due to DM 07/31/2008  . Hyperlipidemia LDL goal <70 07/31/2008  . OBESITY 07/31/2008  . LEUKOCYTOSIS 07/31/2008  . DEPRESSION 07/31/2008  . Hypertensive heart disease with CHF 07/31/2008  . Coronary atherosclerosis 07/31/2008  . CAROTID ARTERY STENOSIS 07/31/2008  . CVA 07/31/2008  . POLYMYALGIA RHEUMATICA 07/31/2008  . DIABETES MELLITUS, TYPE II, HX OF 07/31/2008  . ANEURYSM, HX OF 07/31/2008    CBC    Component Value Date/Time   WBC 8.9 07/29/2014 0612   RBC 4.10 07/29/2014 0612   HGB 12.6 07/29/2014 0612   HCT 39.6 07/29/2014 0612   PLT 95* 07/29/2014 0612   MCV 96.6 07/29/2014 0612   LYMPHSABS 4.2* 12/26/2013 2052   MONOABS 1.1* 12/26/2013 2052   EOSABS 0.1 12/26/2013 2052   BASOSABS 0.0 12/26/2013 2052    CMP     Component Value Date/Time   NA 148* 08/11/2014   NA 143 07/29/2014 0612   K 4.4 08/11/2014   CL 106 07/29/2014 0612   CO2 30 07/29/2014 0612   GLUCOSE 86 07/29/2014 0612   BUN 25* 08/11/2014   BUN 19 07/29/2014 0612   CREATININE 0.81 07/29/2014 0612   CALCIUM 8.8 07/29/2014 0612   PROT 7.5 12/26/2013 2052   ALBUMIN 3.8 12/26/2013 2052   AST 31 08/11/2014   ALT 12 08/11/2014   ALKPHOS 83 12/26/2013 2052   BILITOT 0.4 12/26/2013 2052   GFRNONAA 64* 07/29/2014 0612   GFRAA 74* 07/29/2014  0612    Assessment and Plan  Hyperlipidemia LDL goal <70 Pt had been onlow dose statin, decided to d/c 2/2 age and to decrease number of pills taken; Plan d/c statin and repeat FLP in several months  GERD (gastroesophageal reflux disease) Has been without sx for 9+ months , h/o GI bleed, will attempt GDR;Plan -d/c prilosec and decrease carafate to 1 gm BID and monitor for recurrent sx  Allergic rhinitis Allergy sx have been controlled for 6 + monthsPlan- GDR zyrtec to prn amd monitor    Margit Hanks, MD

## 2014-11-20 NOTE — Assessment & Plan Note (Signed)
Has been without sx for 9+ months , h/o GI bleed, will attempt GDR;Plan -d/c prilosec and decrease carafate to 1 gm BID and monitor for recurrent sx

## 2014-11-20 NOTE — Assessment & Plan Note (Signed)
Allergy sx have been controlled for 6 + monthsPlan- GDR zyrtec to prn amd monitor

## 2014-12-30 ENCOUNTER — Encounter: Payer: Self-pay | Admitting: Internal Medicine

## 2014-12-30 ENCOUNTER — Non-Acute Institutional Stay (SKILLED_NURSING_FACILITY): Payer: Medicare Other | Admitting: Internal Medicine

## 2014-12-30 DIAGNOSIS — E1143 Type 2 diabetes mellitus with diabetic autonomic (poly)neuropathy: Secondary | ICD-10-CM | POA: Diagnosis not present

## 2014-12-30 DIAGNOSIS — D696 Thrombocytopenia, unspecified: Secondary | ICD-10-CM

## 2014-12-30 DIAGNOSIS — E118 Type 2 diabetes mellitus with unspecified complications: Secondary | ICD-10-CM

## 2014-12-30 DIAGNOSIS — D638 Anemia in other chronic diseases classified elsewhere: Secondary | ICD-10-CM | POA: Diagnosis not present

## 2014-12-30 NOTE — Assessment & Plan Note (Addendum)
Chronic and stable with last PLT 100,000; Plan - cont antiplatelet tx, ASA 81 mg daily and cont monitor

## 2014-12-30 NOTE — Assessment & Plan Note (Signed)
Stable and chronic and controlled on neurontin 100 mg BID; Plan - cont current neurontin

## 2014-12-30 NOTE — Progress Notes (Signed)
MRN: [id number] Name: [name] [name]  Sex: female Age: 81 y.o. DOB: 04-27-1927  [facility] #: [name] Facility/Room: Level Of Care: SNF Provider: Merrilee Seashore D Emergency Contacts: Extended Emergency Contact Information Primary Emergency Contact: [name],[name] Address: [address]          [place], [place] [address] [name] [name] of [place] Home Phone: [phone] Mobile Phone: [phone] Relation: Daughter Secondary Emergency Contact: [name] [name] Address: [address] [place] [address] [name] [name] of [place] Home Phone: [phone] Mobile Phone: [phone] Relation: Son  Code Status: DNR  Allergies: Benadryl; Codeine; Hydrocodone; Ibuprofen; Penicillins; and Sulfonamide derivatives  Chief Complaint  Patient presents with  . Medical Management of Chronic Issues    HPI: Patient is 81 y.o. female who is being seen for routine issues of polyneuropathy, anemia and thrombocytopenia.  Past Medical History  Diagnosis Date  . Coronary atherosclerosis of unspecified type of vessel, native or graft   . Depressive disorder, not elsewhere classified   . Type II or unspecified type diabetes mellitus with neurological manifestations, not stated as uncontrolled   . Other and unspecified hyperlipidemia   . Unspecified essential hypertension   . Occlusion and stenosis of carotid artery without mention of cerebral infarction   . Personal history of other diseases of circulatory system   . Unspecified cerebral artery occlusion with cerebral infarction   . Polymyalgia rheumatica   . Obesity, unspecified   . Leukocytosis, unspecified     Past Surgical History  Procedure Laterality Date  . Total abdominal hysterectomy    . Appendectomy    . Cataract surgery      bilateral w intraocular placement  . Refractive surgery      both eyes x6  . Posterior laminectomy / decompression cervical spine      w diskectomy and fusion of c5-c7 and anterior cervical  plate  . Surgery of kidney stones      a few years ago      Medication List       This list is accurate as of: 12/30/14  7:13 PM.  Always use your most recent med list.               amLODipine 10 MG tablet  Commonly known as:  NORVASC  Take 10 mg by mouth every morning.     aspirin 81 MG chewable tablet  Chew 81 mg by mouth every morning.     atenolol 25 MG tablet  Commonly known as:  TENORMIN  Take 25 mg by mouth every morning.     bisacodyl 10 MG suppository  Commonly known as:  DULCOLAX  Place 10 mg rectally as needed for moderate constipation.     cetirizine 10 MG tablet  Commonly known as:  ZYRTEC  Take 10 mg by mouth every morning.     clonazePAM 0.5 MG tablet  Commonly known as:  KLONOPIN  Take one tablet by mouth twice daily     divalproex 125 MG capsule  Commonly known as:  DEPAKOTE SPRINKLE  Take 500 mg by mouth 3 (three) times daily.     ferrous sulfate 325 (65 FE) MG tablet  Take 325 mg by mouth daily with breakfast.     gabapentin 100 MG capsule  Commonly known as:  NEURONTIN  Take 100 mg by mouth 2 (two) times daily.     insulin detemir 100 UNIT/ML injection  Commonly known as:  LEVEMIR  Inject 28 Units  into the skin every morning.     ipratropium-albuterol 0.5-2.5 (3) MG/3ML Soln  Commonly known as:  DUONEB  Take 3 mLs by nebulization every 6 (six) hours as needed (shortness of breath).     linagliptin 5 MG Tabs tablet  Commonly known as:  TRADJENTA  Take 5 mg by mouth every morning. For diabetes     lisinopril 5 MG tablet  Commonly known as:  PRINIVIL,ZESTRIL  Take 5 mg by mouth daily.     LORazepam 2 MG/ML injection  Commonly known as:  ATIVAN  Give 0.19ml intramuscular every 6 hours as needed for agitation/aggressive behavior     magnesium hydroxide 400 MG/5ML suspension  Commonly known as:  MILK OF MAGNESIA  Take 30 mLs by mouth daily as needed for mild constipation.     nitroGLYCERIN 0.4 MG SL tablet  Commonly known as:   NITROSTAT  Place 0.4 mg under the tongue every 5 (five) minutes as needed for chest pain.     OLANZapine zydis 5 MG disintegrating tablet  Commonly known as:  ZYPREXA  5 mg at bedtime.     omeprazole 20 MG capsule  Commonly known as:  PRILOSEC  Take 20 mg by mouth daily.     QUEtiapine 50 MG tablet  Commonly known as:  SEROQUEL  Take 50 mg by mouth 2 (two) times daily. For psychosis     sucralfate 1 G tablet  Commonly known as:  CARAFATE  Take 1 g by mouth every 6 (six) hours.        No orders of the defined types were placed in this encounter.    Immunization History  Administered Date(s) Administered  . Influenza Whole 01/30/2013  . Influenza-Unspecified 01/30/2013  . PPD Test 07/27/2008    Social History  Substance Use Topics  . Smoking status: Never Smoker   . Smokeless tobacco: Never Used  . Alcohol Use: No    Review of Systems  DATA OBTAINED: from patient, nurse, medical record GENERAL:  no fevers, fatigue, appetite changes SKIN: No itching, rash HEENT: No complaint RESPIRATORY: No cough, wheezing, SOB CARDIAC: No chest pain, palpitations, lower extremity edema  GI: No abdominal pain, No N/V/D or constipation, No heartburn or reflux  GU: No dysuria, frequency or urgency, or incontinence  MUSCULOSKELETAL: No unrelieved bone/joint pain NEUROLOGIC: No headache, dizziness  PSYCHIATRIC: No overt anxiety or sadness  Filed Vitals:   12/30/14 1857  BP: 121/65  Pulse: 77  Temp: 96.9 F (36.1 C)  Resp: 19    Physical Exam  GENERAL APPEARANCE: Alert, No acute distress in WC in room  SKIN: No diaphoresis rash HEENT: Unremarkable RESPIRATORY: Breathing is even, unlabored. Lung sounds are clear   CARDIOVASCULAR: Heart RRR no murmurs, rubs or gallops. No peripheral edema  GASTROINTESTINAL: Abdomen is soft, non-tender, not distended w/ normal bowel sounds.  GENITOURINARY: Bladder non tender, not distended  MUSCULOSKELETAL: No abnormal joints or  musculature NEUROLOGIC: Cranial nerves 2-12 grossly intact. Moves all extremities PSYCHIATRIC: baseline confusion, pleasant, no behavioral issues  Patient Active Problem List   Diagnosis Date Noted  . Anemia, chronic disease 12/30/2014  . Thrombocytopenia 12/30/2014  . Allergic rhinitis 11/20/2014  . Psychosis in elderly with behavioral disturbance 09/13/2014  . Falls frequently 09/13/2014  . Diastolic CHF, chronic 06/05/2014  . DM (diabetes mellitus), type 2 with complications 06/05/2014  . GERD (gastroesophageal reflux disease) 06/05/2014  . Bipolar disorder 05/05/2014  . CKD (chronic kidney disease) stage 3, GFR 30-59 ml/min 02/13/2014  . Aggression  12/27/2013  . Vascular dementia with behavioral disturbance 12/27/2013  . Dyspnea 11/17/2010  . Peripheral autonomic neuropathy due to DM 07/31/2008  . Hyperlipidemia LDL goal <70 07/31/2008  . OBESITY 07/31/2008  . LEUKOCYTOSIS 07/31/2008  . DEPRESSION 07/31/2008  . Hypertensive heart disease with CHF 07/31/2008  . Coronary atherosclerosis 07/31/2008  . CAROTID ARTERY STENOSIS 07/31/2008  . CVA 07/31/2008  . POLYMYALGIA RHEUMATICA 07/31/2008  . DIABETES MELLITUS, TYPE II, HX OF 07/31/2008  . ANEURYSM, HX OF 07/31/2008    CBC    Component Value Date/Time   WBC 8.9 07/29/2014 0612   RBC 4.10 07/29/2014 0612   HGB 12.6 07/29/2014 0612   HCT 39.6 07/29/2014 0612   PLT 95* 07/29/2014 0612   MCV 96.6 07/29/2014 0612   LYMPHSABS 4.2* 12/26/2013 2052   MONOABS 1.1* 12/26/2013 2052   EOSABS 0.1 12/26/2013 2052   BASOSABS 0.0 12/26/2013 2052    CMP     Component Value Date/Time   NA 148* 08/11/2014   NA 143 07/29/2014 0612   K 4.4 08/11/2014   CL 106 07/29/2014 0612   CO2 30 07/29/2014 0612   GLUCOSE 86 07/29/2014 0612   BUN 25* 08/11/2014   BUN 19 07/29/2014 0612   CREATININE 0.81 07/29/2014 0612   CALCIUM 8.8 07/29/2014 0612   PROT 7.5 12/26/2013 2052   ALBUMIN 3.8 12/26/2013 2052   AST 31 08/11/2014   ALT 12  08/11/2014   ALKPHOS 83 12/26/2013 2052   BILITOT 0.4 12/26/2013 2052   GFRNONAA 64* 07/29/2014 0612   GFRAA 74* 07/29/2014 0612    Assessment and Plan  Peripheral autonomic neuropathy due to DM Stable and chronic and controlled on neurontin 100 mg BID; Plan - cont current neurontin  Anemia, chronic disease In 8/1 HB 11.4/HCT 36.2; h/o fluctuations in HB, no signs or sx GI bleeding; Plan - cont iron, order studies   Thrombocytopenia Chronic and stable with last PLT 100,000; Plan - cont antiplatelet tx, ASA 81 mg daily and cont monitor    Margit Hanks, MD

## 2014-12-30 NOTE — Assessment & Plan Note (Signed)
In 8/1 HB 11.4/HCT 36.2; h/o fluctuations in HB, no signs or sx GI bleeding; Plan - cont iron, order studies

## 2015-01-09 ENCOUNTER — Emergency Department (HOSPITAL_COMMUNITY)
Admission: EM | Admit: 2015-01-09 | Discharge: 2015-01-09 | Disposition: A | Discharge status: Skilled Nursing Facility | Payer: Medicare Other | Attending: Emergency Medicine | Admitting: Emergency Medicine

## 2015-01-09 ENCOUNTER — Encounter (HOSPITAL_COMMUNITY): Payer: Self-pay

## 2015-01-09 ENCOUNTER — Emergency Department (HOSPITAL_COMMUNITY): Payer: Medicare Other

## 2015-01-09 DIAGNOSIS — S01111A Laceration without foreign body of right eyelid and periocular area, initial encounter: Secondary | ICD-10-CM | POA: Insufficient documentation

## 2015-01-09 DIAGNOSIS — Z791 Long term (current) use of non-steroidal anti-inflammatories (NSAID): Secondary | ICD-10-CM | POA: Insufficient documentation

## 2015-01-09 DIAGNOSIS — Z7982 Long term (current) use of aspirin: Secondary | ICD-10-CM | POA: Diagnosis not present

## 2015-01-09 DIAGNOSIS — Z79899 Other long term (current) drug therapy: Secondary | ICD-10-CM | POA: Insufficient documentation

## 2015-01-09 DIAGNOSIS — S0101XA Laceration without foreign body of scalp, initial encounter: Secondary | ICD-10-CM | POA: Insufficient documentation

## 2015-01-09 DIAGNOSIS — W1789XA Other fall from one level to another, initial encounter: Secondary | ICD-10-CM | POA: Insufficient documentation

## 2015-01-09 DIAGNOSIS — Y9389 Activity, other specified: Secondary | ICD-10-CM | POA: Insufficient documentation

## 2015-01-09 DIAGNOSIS — I10 Essential (primary) hypertension: Secondary | ICD-10-CM | POA: Insufficient documentation

## 2015-01-09 DIAGNOSIS — F329 Major depressive disorder, single episode, unspecified: Secondary | ICD-10-CM | POA: Diagnosis not present

## 2015-01-09 DIAGNOSIS — Z8589 Personal history of malignant neoplasm of other organs and systems: Secondary | ICD-10-CM | POA: Insufficient documentation

## 2015-01-09 DIAGNOSIS — Z23 Encounter for immunization: Secondary | ICD-10-CM | POA: Diagnosis not present

## 2015-01-09 DIAGNOSIS — Z8673 Personal history of transient ischemic attack (TIA), and cerebral infarction without residual deficits: Secondary | ICD-10-CM | POA: Insufficient documentation

## 2015-01-09 DIAGNOSIS — E669 Obesity, unspecified: Secondary | ICD-10-CM | POA: Insufficient documentation

## 2015-01-09 DIAGNOSIS — I2510 Atherosclerotic heart disease of native coronary artery without angina pectoris: Secondary | ICD-10-CM | POA: Diagnosis not present

## 2015-01-09 DIAGNOSIS — W19XXXA Unspecified fall, initial encounter: Secondary | ICD-10-CM

## 2015-01-09 DIAGNOSIS — Z880 Allergy status to penicillin: Secondary | ICD-10-CM | POA: Insufficient documentation

## 2015-01-09 DIAGNOSIS — Z794 Long term (current) use of insulin: Secondary | ICD-10-CM | POA: Insufficient documentation

## 2015-01-09 DIAGNOSIS — S0990XA Unspecified injury of head, initial encounter: Secondary | ICD-10-CM | POA: Diagnosis present

## 2015-01-09 DIAGNOSIS — Y999 Unspecified external cause status: Secondary | ICD-10-CM | POA: Insufficient documentation

## 2015-01-09 DIAGNOSIS — S0181XA Laceration without foreign body of other part of head, initial encounter: Secondary | ICD-10-CM

## 2015-01-09 DIAGNOSIS — Y92129 Unspecified place in nursing home as the place of occurrence of the external cause: Secondary | ICD-10-CM | POA: Insufficient documentation

## 2015-01-09 DIAGNOSIS — G309 Alzheimer's disease, unspecified: Secondary | ICD-10-CM | POA: Insufficient documentation

## 2015-01-09 DIAGNOSIS — E1149 Type 2 diabetes mellitus with other diabetic neurological complication: Secondary | ICD-10-CM | POA: Diagnosis not present

## 2015-01-09 LAB — CBC WITH DIFFERENTIAL/PLATELET
Basophils Absolute: 0 10*3/uL (ref 0.0–0.1)
Basophils Relative: 0 % (ref 0–1)
EOS ABS: 0.1 10*3/uL (ref 0.0–0.7)
Eosinophils Relative: 1 % (ref 0–5)
HEMATOCRIT: 38.6 % (ref 36.0–46.0)
HEMOGLOBIN: 12.2 g/dL (ref 12.0–15.0)
LYMPHS ABS: 4 10*3/uL (ref 0.7–4.0)
Lymphocytes Relative: 33 % (ref 12–46)
MCH: 29.1 pg (ref 26.0–34.0)
MCHC: 31.6 g/dL (ref 30.0–36.0)
MCV: 92.1 fL (ref 78.0–100.0)
MONOS PCT: 9 % (ref 3–12)
Monocytes Absolute: 1.1 10*3/uL — ABNORMAL HIGH (ref 0.1–1.0)
NEUTROS ABS: 6.9 10*3/uL (ref 1.7–7.7)
NEUTROS PCT: 57 % (ref 43–77)
Platelets: 131 10*3/uL — ABNORMAL LOW (ref 150–400)
RBC: 4.19 MIL/uL (ref 3.87–5.11)
RDW: 14.5 % (ref 11.5–15.5)
WBC: 12 10*3/uL — ABNORMAL HIGH (ref 4.0–10.5)

## 2015-01-09 LAB — I-STAT CHEM 8, ED
BUN: 25 mg/dL — ABNORMAL HIGH (ref 6–20)
Calcium, Ion: 1.21 mmol/L (ref 1.13–1.30)
Chloride: 101 mmol/L (ref 101–111)
Creatinine, Ser: 0.8 mg/dL (ref 0.44–1.00)
GLUCOSE: 149 mg/dL — AB (ref 65–99)
HEMATOCRIT: 40 % (ref 36.0–46.0)
Hemoglobin: 13.6 g/dL (ref 12.0–15.0)
Potassium: 4.5 mmol/L (ref 3.5–5.1)
Sodium: 141 mmol/L (ref 135–145)
TCO2: 30 mmol/L (ref 0–100)

## 2015-01-09 LAB — CBG MONITORING, ED: Glucose-Capillary: 124 mg/dL — ABNORMAL HIGH (ref 65–99)

## 2015-01-09 MED ORDER — TETANUS-DIPHTH-ACELL PERTUSSIS 5-2.5-18.5 LF-MCG/0.5 IM SUSP
0.5000 mL | Freq: Once | INTRAMUSCULAR | Status: AC
  Administered 2015-01-09: 0.5 mL via INTRAMUSCULAR
  Filled 2015-01-09: qty 0.5

## 2015-01-09 MED ORDER — LIDOCAINE-EPINEPHRINE (PF) 2 %-1:200000 IJ SOLN
20.0000 mL | Freq: Once | INTRAMUSCULAR | Status: AC
  Administered 2015-01-09: 20 mL
  Filled 2015-01-09: qty 20

## 2015-01-09 NOTE — ED Provider Notes (Signed)
81 yo female that p/w fall out of wheelchair at [facility]. Unwitnessed but immediately responsive.  Pt has 2 in lac to forehead that is repaired w/ absorbable suture. Pt is getting CT head/neck. If normal pt can return to SNF.  Patient's CT head and neck without any evidence of traumatic injury. Patient's labs are reassuring. Patient was discharged back to SNF.    Beverely Risen, MD 01/09/15 [phone]

## 2015-01-09 NOTE — ED Notes (Signed)
Per Ranelle [name], RN - C-collar may be removed, per EDP.

## 2015-01-09 NOTE — ED Provider Notes (Signed)
CSN: [id number]     Arrival date & time 01/09/15  1405 History   First MD Initiated Contact with Patient 01/09/15 1412     Chief Complaint  Patient presents with  . Fall     (Consider location/radiation/quality/duration/timing/severity/associated sxs/prior Treatment) HPI   LEVEL V Caveat- Alzheimer's (DNR)  Per EMS  report patient had an unwitnessed fall. Patient comes from [facility]  Per [facility]-  Pt is verbal and typically follows simple commands at baseline She wheeled herself to the dining room for lunch today ate and felt herself normal and while wheeling herself back to her room the caregivers heard a thud. When they went to investigate they saw her face down on the floor. Therefore she did not lose consciousness because she answered the questions immediately and denied being in significant pain.  Pt here is awake.   Past Medical History  Diagnosis Date  . Coronary atherosclerosis of unspecified type of vessel, native or graft   . Depressive disorder, not elsewhere classified   . Type II or unspecified type diabetes mellitus with neurological manifestations, not stated as uncontrolled   . Other and unspecified hyperlipidemia   . Unspecified essential hypertension   . Occlusion and stenosis of carotid artery without mention of cerebral infarction   . Personal history of other diseases of circulatory system   . Unspecified cerebral artery occlusion with cerebral infarction   . Polymyalgia rheumatica   . Obesity, unspecified   . Leukocytosis, unspecified    Past Surgical History  Procedure Laterality Date  . Total abdominal hysterectomy    . Appendectomy    . Cataract surgery      bilateral w intraocular placement  . Refractive surgery      both eyes x6  . Posterior laminectomy / decompression cervical spine      w diskectomy and fusion of c5-c7 and anterior cervical plate  . Surgery of kidney stones      a few years ago    Family History  Problem Relation Age of Onset  . Coronary artery disease Other    Social History  Substance Use Topics  . Smoking status: Never Smoker   . Smokeless tobacco: Never Used  . Alcohol Use: No   OB History    No data available     Review of Systems  LEVEL V Caveat- Alzheimer's (DNR)  Allergies  Benadryl; Codeine; Hydrocodone; Ibuprofen; Penicillins; and Sulfonamide derivatives  Home Medications   Prior to Admission medications   Medication Sig Start Date End Date Taking? Authorizing Provider  amLODipine (NORVASC) 10 MG tablet Take 10 mg by mouth every morning.    Yes Historical Provider, MD  aspirin 81 MG chewable tablet Chew 81 mg by mouth every morning.   Yes Historical Provider, MD  atenolol (TENORMIN) 25 MG tablet Take 25 mg by mouth every morning.    Yes Historical Provider, MD  bisacodyl (DULCOLAX) 10 MG suppository Place 10 mg rectally as needed for moderate constipation.   Yes Historical Provider, MD  Cranberry-Vitamin C-Inulin (UTI-STAT PO) Take 30 mLs by mouth daily.   Yes Historical Provider, MD  divalproex (DEPAKOTE ER) 250 MG 24 hr tablet Take 500 mg by mouth at bedtime.   Yes Historical Provider, MD  Emollient (GOLD BOND ULTIMATE) LOTN Apply 1 application topically 2 (two) times daily.   Yes Historical Provider, MD  ferrous sulfate 325 (65 FE) MG tablet Take 325 mg by mouth daily with breakfast.  Yes Historical Provider, MD  gabapentin (NEURONTIN) 100 MG capsule Take 100 mg by mouth 2 (two) times daily.    Yes Historical Provider, MD  GLUCERNA (GLUCERNA) LIQD Take 237 mLs by mouth 3 (three) times daily.   Yes Historical Provider, MD  Insulin Glargine (LANTUS SOLOSTAR) 100 UNIT/ML Solostar Pen Inject 25 Units into the skin daily.   Yes Historical Provider, MD  insulin lispro (HUMALOG KWIKPEN) 100 UNIT/ML KiwkPen Inject 0-10 Units into the skin 3 (three) times daily after meals. 5 units after meals if CBG is over 250 and 10 units after meals if CBG is  over 400   Yes Historical Provider, MD  isosorbide mononitrate (IMDUR) 30 MG 24 hr tablet Take 30 mg by mouth daily.   Yes Historical Provider, MD  linagliptin (TRADJENTA) 5 MG TABS tablet Take 5 mg by mouth every morning. For diabetes   Yes Historical Provider, MD  lisinopril (PRINIVIL,ZESTRIL) 5 MG tablet Take 5 mg by mouth daily.   Yes Historical Provider, MD  magnesium hydroxide (MILK OF MAGNESIA) 400 MG/5ML suspension Take 30 mLs by mouth daily as needed for mild constipation.   Yes Historical Provider, MD  nitroGLYCERIN (NITROSTAT) 0.4 MG SL tablet Place 0.4 mg under the tongue every 5 (five) minutes as needed for chest pain.   Yes Historical Provider, MD  OLANZapine (ZYPREXA) 5 MG tablet Take 5 mg by mouth at bedtime.   Yes Historical Provider, MD  PRESCRIPTION MEDICATION Take 120 mLs by mouth 3 (three) times daily. Medpass   Yes Historical Provider, MD  QUEtiapine (SEROQUEL) 50 MG tablet Take 50 mg by mouth 2 (two) times daily. For psychosis   Yes Historical Provider, MD  senna (SENOKOT) 8.6 MG tablet Take 2 tablets by mouth daily.   Yes Historical Provider, MD  sucralfate (CARAFATE) 1 G tablet Take 1 g by mouth 2 (two) times daily.    Yes Historical Provider, MD  Vitamin D, Ergocalciferol, (DRISDOL) 50000 UNITS CAPS capsule Take 50,000 Units by mouth every 30 (thirty) days.   Yes Historical Provider, MD  clonazePAM (KLONOPIN) 0.5 MG tablet Take one tablet by mouth twice daily 05/18/14   Sharon Seller, NP  divalproex (DEPAKOTE SPRINKLE) 125 MG capsule Take 375-500 mg by mouth 2 (two) times daily. 375mg  at 9am and at 12noon    Historical Provider, MD  LORazepam (ATIVAN) 2 MG/ML injection Give 0.89ml intramuscular every 6 hours as needed for agitation/aggressive behavior Patient not taking: Reported on 01/09/2015 03/03/14   Kimber Relic, MD   BP 102/42 mmHg  Pulse 70  Temp(Src) 98.1 F (36.7 C) (Oral)  Resp 18  SpO2 97% Physical Exam  Constitutional: She appears well-developed and  well-nourished. No distress.  HENT:  Head: Normocephalic. Head is with abrasion, with contusion, with laceration and with right periorbital erythema. Head is without raccoon's eyes, without Battle's sign and without left periorbital erythema.    Eyes: Pupils are equal, round, and reactive to light.  Neck: Normal range of motion. Neck supple.  Cardiovascular: Normal rate and regular rhythm.   Pulmonary/Chest: Effort normal.  Abdominal: Soft.  Musculoskeletal:  Pt is stiff to her extremities but has full ROM and does appear to be in pain with ranged. No crepitus or obvious deformities.  Neurological: She is alert.  Skin: Skin is warm and dry.  Nursing note and vitals reviewed.   ED Course  Procedures (including critical care time) Labs Review Labs Reviewed  CBC WITH DIFFERENTIAL/PLATELET - Abnormal; Notable for the following:  WBC 12.0 (*)    Platelets 131 (*)    Monocytes Absolute 1.1 (*)    All other components within normal limits  CBG MONITORING, ED - Abnormal; Notable for the following:    Glucose-Capillary 124 (*)    All other components within normal limits  I-STAT CHEM 8, ED - Abnormal; Notable for the following:    BUN 25 (*)    Glucose, Bld 149 (*)    All other components within normal limits  CBG MONITORING, ED    Imaging Review No results found. I have personally reviewed and evaluated these images and lab results as part of my medical decision-making.   EKG Interpretation None      MDM   Final diagnoses:  Fall, initial encounter  Facial laceration, initial encounter    LACERATION REPAIR Performed by: Dorthula Matas Authorized by: Dorthula Matas Consent: Verbal consent obtained. Risks and benefits: risks, benefits and alternatives were discussed Consent given by: patient Patient identity confirmed: provided demographic data Prepped and Draped in normal sterile fashion Wound explored  Laceration Location: right frontal scalp, above  eyebrow  Laceration Length: 2 inches  No Foreign Bodies seen or palpated  Anesthesia: local infiltration  Local anesthetic: lidocaine  2% with epinephrine  Anesthetic total: 4 ml  Irrigation method: syringe Amount of cleaning: standard  Skin closure: sutures - chromic gut  Number of sutures: 17,  (13- subq and 4 buried)  Technique: simple interrupted.  Patient tolerance: Patient tolerated the procedure well with no immediate complications.   All sutures are dissolvable and will not require suture removal.  Dr. Ethelda Chick has seen the patient as well. Waiting for head and neck cervical spine. Can be discharged back to facility if not acute. Tetanus given at todays visit.  At end of shift, patient hand off to Lynne Leader MD, the Emergency Medicine physician. CT head and cervical spine are pending.  Marlon Pel, PA-C 01/09/15 1613  Doug Sou, MD 01/09/15 [phone]

## 2015-01-09 NOTE — ED Notes (Signed)
Patient transported to CT 

## 2015-01-09 NOTE — ED Notes (Signed)
Pt. From [facility], fell from a sitting position out of her wheelchair.  Pt. Has a laceration above rt. Eye approximately 2 inches in length, full thickness.  Pt. Has alzheimers,

## 2015-01-09 NOTE — Discharge Instructions (Signed)
Facial Laceration  A facial laceration is a cut on the face. These injuries can be painful and cause bleeding. Lacerations usually heal quickly, but they need special care to reduce scarring. DIAGNOSIS  Your health care provider will take a medical history, ask for details about how the injury occurred, and examine the wound to determine how deep the cut is. TREATMENT  Some facial lacerations may not require closure. Others may not be able to be closed because of an increased risk of infection. The risk of infection and the chance for successful closure will depend on various factors, including the amount of time since the injury occurred. The wound may be cleaned to help prevent infection. If closure is appropriate, pain medicines may be given if needed. Your health care provider will use stitches (sutures), wound glue (adhesive), or skin adhesive strips to repair the laceration. These tools bring the skin edges together to allow for faster healing and a better cosmetic outcome. If needed, you may also be given a tetanus shot. HOME CARE INSTRUCTIONS  Only take over-the-counter or prescription medicines as directed by your health care provider.  Follow your health care provider's instructions for wound care. These instructions will vary depending on the technique used for closing the wound. For Sutures:  Keep the wound clean and dry.   If you were given a bandage (dressing), you should change it at least once a day. Also change the dressing if it becomes wet or dirty, or as directed by your health care provider.   Wash the wound with soap and water 2 times a day. Rinse the wound off with water to remove all soap. Pat the wound dry with a clean towel.   After cleaning, apply a thin layer of the antibiotic ointment recommended by your health care provider. This will help prevent infection and keep the dressing from sticking.   You may shower as usual after the first 24 hours. Do not soak the  wound in water until the sutures are removed.   Get your sutures removed as directed by your health care provider. With facial lacerations, sutures should usually be taken out after 4-5 days to avoid stitch marks.   Wait a few days after your sutures are removed before applying any makeup. For Skin Adhesive Strips:  Keep the wound clean and dry.   Do not get the skin adhesive strips wet. You may bathe carefully, using caution to keep the wound dry.   If the wound gets wet, pat it dry with a clean towel.   Skin adhesive strips will fall off on their own. You may trim the strips as the wound heals. Do not remove skin adhesive strips that are still stuck to the wound. They will fall off in time.  For Wound Adhesive:  You may briefly wet your wound in the shower or bath. Do not soak or scrub the wound. Do not swim. Avoid periods of heavy sweating until the skin adhesive has fallen off on its own. After showering or bathing, gently pat the wound dry with a clean towel.   Do not apply liquid medicine, cream medicine, ointment medicine, or makeup to your wound while the skin adhesive is in place. This may loosen the film before your wound is healed.   If a dressing is placed over the wound, be careful not to apply tape directly over the skin adhesive. This may cause the adhesive to be pulled off before the wound is healed.   Avoid   prolonged exposure to sunlight or tanning lamps while the skin adhesive is in place.  The skin adhesive will usually remain in place for 5-10 days, then naturally fall off the skin. Do not pick at the adhesive film.  After Healing: Once the wound has healed, cover the wound with sunscreen during the day for 1 full year. This can help minimize scarring. Exposure to ultraviolet light in the first year will darken the scar. It can take 1-2 years for the scar to lose its redness and to heal completely.  SEEK IMMEDIATE MEDICAL CARE IF:  You have redness, pain, or  swelling around the wound.   You see ayellowish-white fluid (pus) coming from the wound.   You have chills or a fever.  MAKE SURE YOU:  Understand these instructions.  Will watch your condition.  Will get help right away if you are not doing well or get worse. Document Released: 05/25/2004 Document Revised: 02/05/2013 Document Reviewed: 11/28/2012 ExitCare Patient Information 2015 [employer]. This information is not intended to replace advice given to you by your health care provider. Make sure you discuss any questions you have with your health care provider.  

## 2015-01-09 NOTE — ED Notes (Signed)
Neva Seat, PA at bedside suturing laceration to forehead.

## 2015-01-09 NOTE — ED Provider Notes (Signed)
Patient fell at nursing home out of her wheelchair. Checking her head. Patient is alert., Squeezes my hands on command. HEENT exam there is a laceration about the forehead sutured lungs clear to auscultation abdomen nondistended nontender pelvis stable nontender all 4 extremities without contusion abrasion or tenderness   Doug Sou, MD 01/09/15 1612

## 2015-01-09 NOTE — ED Notes (Signed)
C-collar removed by Luther Redo, EMT.

## 2015-01-09 NOTE — ED Notes (Signed)
Patient returned from CT

## 2015-01-29 ENCOUNTER — Non-Acute Institutional Stay (SKILLED_NURSING_FACILITY): Payer: Medicare Other | Admitting: Internal Medicine

## 2015-01-29 DIAGNOSIS — F0391 Unspecified dementia with behavioral disturbance: Secondary | ICD-10-CM | POA: Diagnosis not present

## 2015-01-29 DIAGNOSIS — F01518 Vascular dementia, unspecified severity, with other behavioral disturbance: Secondary | ICD-10-CM

## 2015-01-29 DIAGNOSIS — F312 Bipolar disorder, current episode manic severe with psychotic features: Secondary | ICD-10-CM | POA: Diagnosis not present

## 2015-01-29 DIAGNOSIS — F03918 Unspecified dementia, unspecified severity, with other behavioral disturbance: Secondary | ICD-10-CM

## 2015-01-29 DIAGNOSIS — F0151 Vascular dementia with behavioral disturbance: Secondary | ICD-10-CM

## 2015-01-29 NOTE — Progress Notes (Signed)
MRN: [id number] Name: [name] [name]  Sex: female Age: 81 y.o. DOB: 1927/04/24  [facility] #: [name] Facility/Room: Level Of Care: SNF Provider: Merrilee Seashore D Emergency Contacts: Extended Emergency Contact Information Primary Emergency Contact: [name],[name] Address: [address]          [place], [place] [address] [name] of [place] Home Phone: [phone] Mobile Phone: [phone] Relation: Daughter Secondary Emergency Contact: [name] Address: [address] [place] [address] [name] of [place] Home Phone: [phone] Mobile Phone: [phone] Relation: Son  Code Status:   Allergies: Benadryl; Codeine; Hydrocodone; Ibuprofen; Penicillins; and Sulfonamide derivatives  Chief Complaint  Patient presents with  . Medical Management of Chronic Issues    HPI: Patient is 81 y.o. female who is being seen for routine issues of bipolar dx, psychoses and dementia.  Past Medical History  Diagnosis Date  . Coronary atherosclerosis of unspecified type of vessel, native or graft   . Depressive disorder, not elsewhere classified   . Type II or unspecified type diabetes mellitus with neurological manifestations, not stated as uncontrolled   . Other and unspecified hyperlipidemia   . Unspecified essential hypertension   . Occlusion and stenosis of carotid artery without mention of cerebral infarction   . Personal history of other diseases of circulatory system   . Unspecified cerebral artery occlusion with cerebral infarction   . Polymyalgia rheumatica (HCC)   . Obesity, unspecified   . Leukocytosis, unspecified     Past Surgical History  Procedure Laterality Date  . Total abdominal hysterectomy    . Appendectomy    . Cataract surgery      bilateral w intraocular placement  . Refractive surgery      both eyes x6  . Posterior laminectomy / decompression cervical spine      w diskectomy and fusion of c5-c7 and anterior cervical plate   . Surgery of kidney stones      a few years ago      Medication List       This list is accurate as of: 01/29/15 11:59 PM.  Always use your most recent med list.               amLODipine 10 MG tablet  Commonly known as:  NORVASC  Take 10 mg by mouth every morning.     aspirin 81 MG chewable tablet  Chew 81 mg by mouth every morning.     atenolol 25 MG tablet  Commonly known as:  TENORMIN  Take 25 mg by mouth every morning.     bisacodyl 10 MG suppository  Commonly known as:  DULCOLAX  Place 10 mg rectally as needed for moderate constipation.     clonazePAM 0.5 MG tablet  Commonly known as:  KLONOPIN  Take one tablet by mouth twice daily     divalproex 125 MG capsule  Commonly known as:  DEPAKOTE SPRINKLE  Take 375-500 mg by mouth 2 (two) times daily.  at 9am and at 12noon     DEPAKOTE ER 250 MG 24 hr tablet  Generic drug:  divalproex  Take 500 mg by mouth at bedtime.     ferrous sulfate 325 (65 FE) MG tablet  Take 325 mg by mouth daily with breakfast.     gabapentin 100 MG capsule  Commonly known as:  NEURONTIN  Take 100 mg by mouth 2 (two) times daily.     GLUCERNA Liqd  Take 237 mLs  by mouth 3 (three) times daily.     GOLD BOND ULTIMATE Lotn  Apply 1 application topically 2 (two) times daily.     HUMALOG KWIKPEN 100 UNIT/ML KiwkPen  Generic drug:  insulin lispro  Inject 0-10 Units into the skin 3 (three) times daily after meals. 5 units after meals if CBG is over 250 and 10 units after meals if CBG is over 400     isosorbide mononitrate 30 MG 24 hr tablet  Commonly known as:  IMDUR  Take 30 mg by mouth daily.     LANTUS SOLOSTAR 100 UNIT/ML Solostar Pen  Generic drug:  Insulin Glargine  Inject 25 Units into the skin daily.     linagliptin 5 MG Tabs tablet  Commonly known as:  TRADJENTA  Take 5 mg by mouth every morning. For diabetes     lisinopril 5 MG tablet  Commonly known as:  PRINIVIL,ZESTRIL  Take 5 mg by mouth daily.      LORazepam 2 MG/ML injection  Commonly known as:  ATIVAN  Give 0.39ml intramuscular every 6 hours as needed for agitation/aggressive behavior     magnesium hydroxide 400 MG/5ML suspension  Commonly known as:  MILK OF MAGNESIA  Take 30 mLs by mouth daily as needed for mild constipation.     nitroGLYCERIN 0.4 MG SL tablet  Commonly known as:  NITROSTAT  Place 0.4 mg under the tongue every 5 (five) minutes as needed for chest pain.     OLANZapine 5 MG tablet  Commonly known as:  ZYPREXA  Take 5 mg by mouth at bedtime.     PRESCRIPTION MEDICATION  Take 120 mLs by mouth 3 (three) times daily. Medpass     QUEtiapine 50 MG tablet  Commonly known as:  SEROQUEL  Take 50 mg by mouth 2 (two) times daily. For psychosis     senna 8.6 MG tablet  Commonly known as:  SENOKOT  Take 2 tablets by mouth daily.     sucralfate 1 G tablet  Commonly known as:  CARAFATE  Take 1 g by mouth 2 (two) times daily.     UTI-STAT PO  Take 30 mLs by mouth daily.     Vitamin D (Ergocalciferol) 50000 UNITS Caps capsule  Commonly known as:  DRISDOL  Take 50,000 Units by mouth every 30 (thirty) days.        No orders of the defined types were placed in this encounter.    Immunization History  Administered Date(s) Administered  . Influenza Whole 01/30/2013  . Influenza-Unspecified 01/30/2013  . PPD Test 07/27/2008  . Tdap 01/09/2015    Social History  Substance Use Topics  . Smoking status: Never Smoker   . Smokeless tobacco: Never Used  . Alcohol Use: No    Review of Systems  DATA OBTAINED: from nurse- no new concerns GENERAL:  no fevers, fatigue, appetite changes SKIN: No itching, rash HEENT: No complaint RESPIRATORY: No cough, wheezing, SOB CARDIAC: No chest pain, palpitations, lower extremity edema  GI: No abdominal pain, No N/V/D or constipation, No heartburn or reflux  GU: No dysuria, frequency or urgency, or incontinence  MUSCULOSKELETAL: No unrelieved bone/joint pain NEUROLOGIC:  No headache, dizziness  PSYCHIATRIC: No overt anxiety or sadness  Filed Vitals:   01/30/15 1510  BP: 112/64  Pulse: 76  Temp: 97.3 F (36.3 C)  Resp: 18    Physical Exam  GENERAL APPEARANCE: Alert,  No acute distress  SKIN: No diaphoresis rash HEENT: Unremarkable RESPIRATORY: Breathing is even,  unlabored. Lung sounds are clear   CARDIOVASCULAR: Heart RRR no murmurs, rubs or gallops. No peripheral edema  GASTROINTESTINAL: Abdomen is soft, non-tender, not distended w/ normal bowel sounds.  GENITOURINARY: Bladder non tender, not distended  MUSCULOSKELETAL: No abnormal joints or musculature NEUROLOGIC: Cranial nerves 2-12 grossly intact. Moves all extremities PSYCHIATRIC: confused, no behavioral issues  Patient Active Problem List   Diagnosis Date Noted  . Anemia, chronic disease 12/30/2014  . Thrombocytopenia 12/30/2014  . Allergic rhinitis 11/20/2014  . Psychosis in elderly with behavioral disturbance 09/13/2014  . Falls frequently 09/13/2014  . Diastolic CHF, chronic 06/05/2014  . DM (diabetes mellitus), type 2 with complications 06/05/2014  . GERD (gastroesophageal reflux disease) 06/05/2014  . Bipolar disorder 05/05/2014  . CKD (chronic kidney disease) stage 3, GFR 30-59 ml/min 02/13/2014  . Aggression 12/27/2013  . Vascular dementia with behavioral disturbance 12/27/2013  . Dyspnea 11/17/2010  . Peripheral autonomic neuropathy due to DM 07/31/2008  . Hyperlipidemia LDL goal <70 07/31/2008  . OBESITY 07/31/2008  . LEUKOCYTOSIS 07/31/2008  . DEPRESSION 07/31/2008  . Hypertensive heart disease with CHF 07/31/2008  . Coronary atherosclerosis 07/31/2008  . CAROTID ARTERY STENOSIS 07/31/2008  . CVA 07/31/2008  . POLYMYALGIA RHEUMATICA 07/31/2008  . DIABETES MELLITUS, TYPE II, HX OF 07/31/2008  . ANEURYSM, HX OF 07/31/2008    CBC    Component Value Date/Time   WBC 12.0* 01/09/2015 1516   RBC 4.19 01/09/2015 1516   HGB 13.6 01/09/2015 1525   HCT 40.0 01/09/2015  1525   PLT 131* 01/09/2015 1516   MCV 92.1 01/09/2015 1516   LYMPHSABS 4.0 01/09/2015 1516   MONOABS 1.1* 01/09/2015 1516   EOSABS 0.1 01/09/2015 1516   BASOSABS 0.0 01/09/2015 1516    CMP     Component Value Date/Time   NA 141 01/09/2015 1525   NA 148* 08/11/2014   K 4.5 01/09/2015 1525   CL 101 01/09/2015 1525   CO2 30 07/29/2014 0612   GLUCOSE 149* 01/09/2015 1525   BUN 25* 01/09/2015 1525   BUN 25* 08/11/2014   CREATININE 0.80 01/09/2015 1525   CALCIUM 8.8 07/29/2014 0612   PROT 7.5 12/26/2013 2052   ALBUMIN 3.8 12/26/2013 2052   AST 31 08/11/2014   ALT 12 08/11/2014   ALKPHOS 83 12/26/2013 2052   BILITOT 0.4 12/26/2013 2052   GFRNONAA 64* 07/29/2014 0612   GFRAA 74* 07/29/2014 0612    Assessment and Plan  Bipolar disorder Manic depressive disorder is chronic and stable; pt had a recent fall with forehead lac so meds are slightly reworked; Plan -decrease depakote to 375 mg TID, cont seroquel 50 mg BID, , Klonopin 0.5 mg BID  Psychosis in elderly with behavioral disturbance Chronic and stable; Plan - cont seroquel 50 mg BID and zyprexa 5 mg q HS  Vascular dementia with behavioral disturbance Chronic, stable, no declines; Plan - as per psychosis and bipolar cont zyprexa, seroquel depakote and klonopin    Margit Hanks, MD

## 2015-01-30 ENCOUNTER — Encounter: Payer: Self-pay | Admitting: Internal Medicine

## 2015-01-30 NOTE — Assessment & Plan Note (Signed)
Chronic and stable; Plan - cont seroquel 50 mg BID and zyprexa 5 mg q HS

## 2015-01-30 NOTE — Assessment & Plan Note (Addendum)
Manic depressive disorder is chronic and stable; pt had a recent fall with forehead lac so meds are slightly reworked; Plan -decrease depakote to 375 mg TID, cont seroquel 50 mg BID, , Klonopin 0.5 mg BID

## 2015-01-30 NOTE — Assessment & Plan Note (Signed)
Chronic, stable, no declines; Plan - as per psychosis and bipolar cont zyprexa, seroquel depakote and klonopin

## 2015-03-03 ENCOUNTER — Encounter: Payer: Self-pay | Admitting: Internal Medicine

## 2015-03-03 ENCOUNTER — Non-Acute Institutional Stay (SKILLED_NURSING_FACILITY): Payer: Medicare Other | Admitting: Internal Medicine

## 2015-03-03 DIAGNOSIS — D692 Other nonthrombocytopenic purpura: Secondary | ICD-10-CM | POA: Diagnosis not present

## 2015-03-03 DIAGNOSIS — R296 Repeated falls: Secondary | ICD-10-CM | POA: Diagnosis not present

## 2015-03-03 DIAGNOSIS — Z794 Long term (current) use of insulin: Secondary | ICD-10-CM

## 2015-03-03 DIAGNOSIS — E118 Type 2 diabetes mellitus with unspecified complications: Secondary | ICD-10-CM

## 2015-03-03 NOTE — Assessment & Plan Note (Signed)
In 11/2014 A1c was 6.9 which is goal; Plan - cont current regimen of insulin and tradgenta and monitoring at intervals

## 2015-03-03 NOTE — Assessment & Plan Note (Signed)
Chronic and ongoing, last fall 10/26 when pt was found sitting on floor in front of toilet; fall prior to that pt had elevated ammonia lr]eel felt to contribute to fall and depakote was dec to 375 mg BID; Plan - monitoring for falls and safety awareness

## 2015-03-03 NOTE — Assessment & Plan Note (Signed)
Chronic and continuous from fragile skin and freq falls;Plan - cont skin care with moisturizers daily, wound care for skin ears, although non at this time

## 2015-03-03 NOTE — Progress Notes (Signed)
MRN: [id number] Name: [name] [name]  Sex: female Age: 81 y.o. DOB: Oct 15, 1926  [name] Facility/Room: Level Of Care: SNF Provider: Merrilee Seashore D Emergency Contacts: Extended Emergency Contact Information Primary Emergency Contact: [name],[name] Address: [address]          [place], [place] [address] [name] of [place] Home Phone: [phone] Mobile Phone: [phone] Relation: Daughter Secondary Emergency Contact: [name] Address: [address], [place] [address] [name] of [place] Home Phone: [phone] Mobile Phone: [phone] Relation: Son  Code Status:   Allergies: Benadryl; Codeine; Hydrocodone; Ibuprofen; Penicillins; and Sulfonamide derivatives  Chief Complaint  Patient presents with  . Medical Management of Chronic Issues    HPI: Patient is 81 y.o. female with dementia, bipolar dx, h/o falls, DM2, HTN who is being seen for routine issues of DM2,  falls and senile purpura.  Past Medical History  Diagnosis Date  . Coronary atherosclerosis of unspecified type of vessel, native or graft   . Depressive disorder, not elsewhere classified   . Type II or unspecified type diabetes mellitus with neurological manifestations, not stated as uncontrolled   . Other and unspecified hyperlipidemia   . Unspecified essential hypertension   . Occlusion and stenosis of carotid artery without mention of cerebral infarction   . Personal history of other diseases of circulatory system   . Unspecified cerebral artery occlusion with cerebral infarction   . Polymyalgia rheumatica (HCC)   . Obesity, unspecified   . Leukocytosis, unspecified     Past Surgical History  Procedure Laterality Date  . Total abdominal hysterectomy    . Appendectomy    . Cataract surgery      bilateral w intraocular placement  . Refractive surgery      both eyes x6  . Posterior laminectomy / decompression cervical spine      w diskectomy and  fusion of c5-c7 and anterior cervical plate  . Surgery of kidney stones      a few years ago      Medication List       This list is accurate as of: 03/03/15  7:46 PM.  Always use your most recent med list.               amLODipine 10 MG tablet  Commonly known as:  NORVASC  Take 10 mg by mouth every morning.     aspirin 81 MG chewable tablet  Chew 81 mg by mouth every morning.     atenolol 25 MG tablet  Commonly known as:  TENORMIN  Take 25 mg by mouth every morning.     bisacodyl 10 MG suppository  Commonly known as:  DULCOLAX  Place 10 mg rectally as needed for moderate constipation.     clonazePAM 0.5 MG tablet  Commonly known as:  KLONOPIN  Take one tablet by mouth twice daily     divalproex 125 MG capsule  Commonly known as:  DEPAKOTE SPRINKLE  Take 375-500 mg by mouth 2 (two) times daily.  at 9am and at 12noon     DEPAKOTE ER 250 MG 24 hr tablet  Generic drug:  divalproex  Take 500 mg by mouth at bedtime.     ferrous sulfate 325 (65 FE) MG tablet  Take 325 mg by mouth daily with breakfast.     gabapentin 100 MG capsule  Commonly known as:  NEURONTIN  Take 100 mg by mouth 2 (two) times daily.  GLUCERNA Liqd  Take 237 mLs by mouth 3 (three) times daily.     GOLD BOND ULTIMATE Lotn  Apply 1 application topically 2 (two) times daily.     HUMALOG KWIKPEN 100 UNIT/ML KiwkPen  Generic drug:  insulin lispro  Inject 0-10 Units into the skin 3 (three) times daily after meals. 5 units after meals if CBG is over 250 and 10 units after meals if CBG is over 400     isosorbide mononitrate 30 MG 24 hr tablet  Commonly known as:  IMDUR  Take 30 mg by mouth daily.     LANTUS SOLOSTAR 100 UNIT/ML Solostar Pen  Generic drug:  Insulin Glargine  Inject 25 Units into the skin daily.     linagliptin 5 MG Tabs tablet  Commonly known as:  TRADJENTA  Take 5 mg by mouth every morning. For diabetes     lisinopril 5 MG tablet  Commonly known as:   PRINIVIL,ZESTRIL  Take 5 mg by mouth daily.     LORazepam 2 MG/ML injection  Commonly known as:  ATIVAN  Give 0.65ml intramuscular every 6 hours as needed for agitation/aggressive behavior     magnesium hydroxide 400 MG/5ML suspension  Commonly known as:  MILK OF MAGNESIA  Take 30 mLs by mouth daily as needed for mild constipation.     nitroGLYCERIN 0.4 MG SL tablet  Commonly known as:  NITROSTAT  Place 0.4 mg under the tongue every 5 (five) minutes as needed for chest pain.     OLANZapine 5 MG tablet  Commonly known as:  ZYPREXA  Take 5 mg by mouth at bedtime.     PRESCRIPTION MEDICATION  Take 120 mLs by mouth 3 (three) times daily. Medpass     QUEtiapine 50 MG tablet  Commonly known as:  SEROQUEL  Take 50 mg by mouth 2 (two) times daily. For psychosis     senna 8.6 MG tablet  Commonly known as:  SENOKOT  Take 2 tablets by mouth daily.     sucralfate 1 G tablet  Commonly known as:  CARAFATE  Take 1 g by mouth 2 (two) times daily.     UTI-STAT PO  Take 30 mLs by mouth daily.     Vitamin D (Ergocalciferol) 50000 UNITS Caps capsule  Commonly known as:  DRISDOL  Take 50,000 Units by mouth every 30 (thirty) days.        No orders of the defined types were placed in this encounter.    Immunization History  Administered Date(s) Administered  . Influenza Whole 01/30/2013  . Influenza-Unspecified 01/30/2013  . PPD Test 07/27/2008  . Tdap 01/09/2015    Social History  Substance Use Topics  . Smoking status: Never Smoker   . Smokeless tobacco: Never Used  . Alcohol Use: No    Review of Systems- UTO from pt; per nursing pt with fall, found sitting in front of toilet , no injury  Filed Vitals:   03/03/15 1836  BP: 112/64  Pulse: 70  Temp: 97.3 F (36.3 C)  Resp: 18    Physical Exam  GENERAL APPEARANCE: Alert, min conversant, No acute distress  SKIN: No diaphoresis rash; BUE with senile purpura chronically, no open wounds HEENT:  Unremarkable RESPIRATORY: Breathing is even, unlabored. Lung sounds are clear   CARDIOVASCULAR: Heart RRR no murmurs, rubs or gallops. No peripheral edema  GASTROINTESTINAL: Abdomen is soft, non-tender, not distended w/ normal bowel sounds.  GENITOURINARY: Bladder non tender, not distended  MUSCULOSKELETAL: No abnormal joints  or musculature NEUROLOGIC: Cranial nerves 2-12 grossly intact. Moves all extremities PSYCHIATRIC: dementia, no behavioral issues  Patient Active Problem List   Diagnosis Date Noted  . Nonthrombocytopenic purpura (HCC) 03/03/2015  . Anemia, chronic disease 12/30/2014  . Thrombocytopenia (HCC) 12/30/2014  . Allergic rhinitis 11/20/2014  . Psychosis in elderly with behavioral disturbance 09/13/2014  . Falls frequently 09/13/2014  . Diastolic CHF, chronic (HCC) 06/05/2014  . DM (diabetes mellitus), type 2 with complications (HCC) 06/05/2014  . GERD (gastroesophageal reflux disease) 06/05/2014  . Bipolar disorder (HCC) 05/05/2014  . CKD (chronic kidney disease) stage 3, GFR 30-59 ml/min 02/13/2014  . Aggression 12/27/2013  . Vascular dementia with behavioral disturbance 12/27/2013  . Dyspnea 11/17/2010  . Peripheral autonomic neuropathy due to DM (HCC) 07/31/2008  . Hyperlipidemia LDL goal <70 07/31/2008  . OBESITY 07/31/2008  . LEUKOCYTOSIS 07/31/2008  . DEPRESSION 07/31/2008  . Hypertensive heart disease with CHF (HCC) 07/31/2008  . Coronary atherosclerosis 07/31/2008  . CAROTID ARTERY STENOSIS 07/31/2008  . CVA 07/31/2008  . POLYMYALGIA RHEUMATICA 07/31/2008  . DIABETES MELLITUS, TYPE II, HX OF 07/31/2008  . ANEURYSM, HX OF 07/31/2008    CBC    Component Value Date/Time   WBC 12.0* 01/09/2015 1516   RBC 4.19 01/09/2015 1516   HGB 13.6 01/09/2015 1525   HCT 40.0 01/09/2015 1525   PLT 131* 01/09/2015 1516   MCV 92.1 01/09/2015 1516   LYMPHSABS 4.0 01/09/2015 1516   MONOABS 1.1* 01/09/2015 1516   EOSABS 0.1 01/09/2015 1516   BASOSABS 0.0 01/09/2015  1516    CMP     Component Value Date/Time   NA 141 01/09/2015 1525   NA 148* 08/11/2014   K 4.5 01/09/2015 1525   CL 101 01/09/2015 1525   CO2 30 07/29/2014 0612   GLUCOSE 149* 01/09/2015 1525   BUN 25* 01/09/2015 1525   BUN 25* 08/11/2014   CREATININE 0.80 01/09/2015 1525   CALCIUM 8.8 07/29/2014 0612   PROT 7.5 12/26/2013 2052   ALBUMIN 3.8 12/26/2013 2052   AST 31 08/11/2014   ALT 12 08/11/2014   ALKPHOS 83 12/26/2013 2052   BILITOT 0.4 12/26/2013 2052   GFRNONAA 64* 07/29/2014 0612   GFRAA 74* 07/29/2014 0612    Assessment and Plan  Nonthrombocytopenic purpura (HCC) Chronic and continuous from fragile skin and freq falls;Plan - cont skin care with moisturizers daily, wound care for skin ears, although non at this time  Falls frequently Chronic and ongoing, last fall 10/26 when pt was found sitting on floor in front of toilet; fall prior to that pt had elevated ammonia lr]eel felt to contribute to fall and depakote was dec to 375 mg BID; Plan - monitoring for falls and safety awareness  DM (diabetes mellitus), type 2 with complications In 11/2014 A1c was 6.9 which is goal; Plan - cont current regimen of insulin and tradgenta and monitoring at intervals    Margit HanksALEXANDER, Ifeanyichukwu Wickham D, MD

## 2015-04-07 ENCOUNTER — Non-Acute Institutional Stay (SKILLED_NURSING_FACILITY): Payer: Medicare Other | Admitting: Internal Medicine

## 2015-04-07 ENCOUNTER — Encounter: Payer: Self-pay | Admitting: Internal Medicine

## 2015-04-07 DIAGNOSIS — I110 Hypertensive heart disease with heart failure: Secondary | ICD-10-CM | POA: Diagnosis not present

## 2015-04-07 DIAGNOSIS — I25119 Atherosclerotic heart disease of native coronary artery with unspecified angina pectoris: Secondary | ICD-10-CM

## 2015-04-07 DIAGNOSIS — I5032 Chronic diastolic (congestive) heart failure: Secondary | ICD-10-CM | POA: Diagnosis not present

## 2015-04-07 NOTE — Assessment & Plan Note (Signed)
Chronic, stable controlled ; plan - cont norvasc 10 mg, atenolol 25 mg daily and imdur 30 mg daily

## 2015-04-07 NOTE — Progress Notes (Signed)
MRN: [id number] Name: [name] [name]  Sex: female Age: 81 y.o. DOB: [birth year]  [facility] #: [name] Facility/Room: Level Of Care: SNF Provider: Merrilee Seashore D Emergency Contacts: Extended Emergency Contact Information Primary Emergency Contact: [name],[name] Address: [address]          [place], [place] [address] [name] of [place] Home Phone: [phone] Mobile Phone: [phone] Relation: Daughter Secondary Emergency Contact: [name] Address: [address] [place] [address] [name] of [place] Home Phone: [phone] Mobile Phone: [phone] Relation: Son  Code Status:   Allergies: Benadryl; Codeine; Hydrocodone; Ibuprofen; Penicillins; and Sulfonamide derivatives  Chief Complaint  Patient presents with  . Medical Management of Chronic Issues    HPI: Patient is 81 y.o. female with dementia, bipolar dx, h/o falls, DM2, HTN who is being seen for routine issues of  HTN, CAD, and CHF.  Past Medical History  Diagnosis Date  . Coronary atherosclerosis of unspecified type of vessel, native or graft   . Depressive disorder, not elsewhere classified   . Type II or unspecified type diabetes mellitus with neurological manifestations, not stated as uncontrolled   . Other and unspecified hyperlipidemia   . Unspecified essential hypertension   . Occlusion and stenosis of carotid artery without mention of cerebral infarction   . Personal history of other diseases of circulatory system   . Unspecified cerebral artery occlusion with cerebral infarction   . Polymyalgia rheumatica (HCC)   . Obesity, unspecified   . Leukocytosis, unspecified     Past Surgical History  Procedure Laterality Date  . Total abdominal hysterectomy    . Appendectomy    . Cataract surgery      bilateral w intraocular placement  . Refractive surgery      both eyes x6  . Posterior laminectomy / decompression cervical spine      w diskectomy and fusion of  c5-c7 and anterior cervical plate  . Surgery of kidney stones      a few years ago      Medication List       This list is accurate as of: 04/07/15  8:47 PM.  Always use your most recent med list.               amLODipine 10 MG tablet  Commonly known as:  NORVASC  Take 10 mg by mouth every morning.     aspirin 81 MG chewable tablet  Chew 81 mg by mouth every morning.     atenolol 25 MG tablet  Commonly known as:  TENORMIN  Take 25 mg by mouth every morning.     bisacodyl 10 MG suppository  Commonly known as:  DULCOLAX  Place 10 mg rectally as needed for moderate constipation.     clonazePAM 0.5 MG tablet  Commonly known as:  KLONOPIN  Take one tablet by mouth twice daily     divalproex 125 MG capsule  Commonly known as:  DEPAKOTE SPRINKLE  Take 375-500 mg by mouth 2 (two) times daily.  at 9am and at 12noon     DEPAKOTE ER 250 MG 24 hr tablet  Generic drug:  divalproex  Take 500 mg by mouth at bedtime.     ferrous sulfate 325 (65 FE) MG tablet  Take 325 mg by mouth daily with breakfast.     gabapentin 100 MG capsule  Commonly known as:  NEURONTIN  Take 100 mg by mouth 2 (two) times daily.  GLUCERNA Liqd  Take 237 mLs by mouth 3 (three) times daily.     GOLD BOND ULTIMATE Lotn  Apply 1 application topically 2 (two) times daily.     HUMALOG KWIKPEN 100 UNIT/ML KiwkPen  Generic drug:  insulin lispro  Inject 0-10 Units into the skin 3 (three) times daily after meals. 5 units after meals if CBG is over 250 and 10 units after meals if CBG is over 400     isosorbide mononitrate 30 MG 24 hr tablet  Commonly known as:  IMDUR  Take 30 mg by mouth daily.     LANTUS SOLOSTAR 100 UNIT/ML Solostar Pen  Generic drug:  Insulin Glargine  Inject 25 Units into the skin daily.     linagliptin 5 MG Tabs tablet  Commonly known as:  TRADJENTA  Take 5 mg by mouth every morning. For diabetes     lisinopril 5 MG tablet  Commonly known as:  PRINIVIL,ZESTRIL  Take 5  mg by mouth daily.     LORazepam 2 MG/ML injection  Commonly known as:  ATIVAN  Give 0.23ml intramuscular every 6 hours as needed for agitation/aggressive behavior     magnesium hydroxide 400 MG/5ML suspension  Commonly known as:  MILK OF MAGNESIA  Take 30 mLs by mouth daily as needed for mild constipation.     nitroGLYCERIN 0.4 MG SL tablet  Commonly known as:  NITROSTAT  Place 0.4 mg under the tongue every 5 (five) minutes as needed for chest pain.     OLANZapine 5 MG tablet  Commonly known as:  ZYPREXA  Take 5 mg by mouth at bedtime.     PRESCRIPTION MEDICATION  Take 120 mLs by mouth 3 (three) times daily. Medpass     QUEtiapine 50 MG tablet  Commonly known as:  SEROQUEL  Take 50 mg by mouth 2 (two) times daily. For psychosis     senna 8.6 MG tablet  Commonly known as:  SENOKOT  Take 2 tablets by mouth daily.     sucralfate 1 G tablet  Commonly known as:  CARAFATE  Take 1 g by mouth 2 (two) times daily.     UTI-STAT PO  Take 30 mLs by mouth daily.     Vitamin D (Ergocalciferol) 50000 UNITS Caps capsule  Commonly known as:  DRISDOL  Take 50,000 Units by mouth every 30 (thirty) days.        No orders of the defined types were placed in this encounter.    Immunization History  Administered Date(s) Administered  . Influenza Whole 01/30/2013  . Influenza-Unspecified 01/30/2013  . PPD Test 07/27/2008  . Tdap 01/09/2015    Social History  Substance Use Topics  . Smoking status: Never Smoker   . Smokeless tobacco: Never Used  . Alcohol Use: No    Review of Systems UTO 2/2 dementia; nursing without concerns    Filed Vitals:   04/07/15 1536  BP: 112/64  Pulse: 69  Temp: 97.3 F (36.3 C)  Resp: 18    Physical Exam  GENERAL APPEARANCE: Alert, min conversant, No acute distress  SKIN: No diaphoresis rash HEENT: Unremarkable RESPIRATORY: Breathing is even, unlabored. Lung sounds are clear   CARDIOVASCULAR: Heart RRR no murmurs, rubs or gallops. No  peripheral edema  GASTROINTESTINAL: Abdomen is soft, non-tender, not distended w/ normal bowel sounds.  GENITOURINARY: Bladder non tender, not distended  MUSCULOSKELETAL: No abnormal joints or musculature NEUROLOGIC: Cranial nerves 2-12 grossly intact. Moves all extremities PSYCHIATRIC: dementia, no behavioral issues  Patient Active Problem List   Diagnosis Date Noted  . Nonthrombocytopenic purpura (HCC) 03/03/2015  . Anemia, chronic disease 12/30/2014  . Thrombocytopenia (HCC) 12/30/2014  . Allergic rhinitis 11/20/2014  . Psychosis in elderly with behavioral disturbance 09/13/2014  . Falls frequently 09/13/2014  . Diastolic CHF, chronic (HCC) 06/05/2014  . DM (diabetes mellitus), type 2 with complications (HCC) 06/05/2014  . GERD (gastroesophageal reflux disease) 06/05/2014  . Bipolar disorder (HCC) 05/05/2014  . CKD (chronic kidney disease) stage 3, GFR 30-59 ml/min 02/13/2014  . Aggression 12/27/2013  . Vascular dementia with behavioral disturbance 12/27/2013  . Dyspnea 11/17/2010  . Peripheral autonomic neuropathy due to DM (HCC) 07/31/2008  . Hyperlipidemia LDL goal <70 07/31/2008  . OBESITY 07/31/2008  . LEUKOCYTOSIS 07/31/2008  . DEPRESSION 07/31/2008  . Hypertensive heart disease with CHF (HCC) 07/31/2008  . Coronary atherosclerosis 07/31/2008  . CAROTID ARTERY STENOSIS 07/31/2008  . CVA 07/31/2008  . POLYMYALGIA RHEUMATICA 07/31/2008  . DIABETES MELLITUS, TYPE II, HX OF 07/31/2008  . ANEURYSM, HX OF 07/31/2008    CBC    Component Value Date/Time   WBC 12.0* 01/09/2015 1516   RBC 4.19 01/09/2015 1516   HGB 13.6 01/09/2015 1525   HCT 40.0 01/09/2015 1525   PLT 131* 01/09/2015 1516   MCV 92.1 01/09/2015 1516   LYMPHSABS 4.0 01/09/2015 1516   MONOABS 1.1* 01/09/2015 1516   EOSABS 0.1 01/09/2015 1516   BASOSABS 0.0 01/09/2015 1516    CMP     Component Value Date/Time   NA 141 01/09/2015 1525   NA 148* 08/11/2014   K 4.5 01/09/2015 1525   CL 101 01/09/2015  1525   CO2 30 07/29/2014 0612   GLUCOSE 149* 01/09/2015 1525   BUN 25* 01/09/2015 1525   BUN 25* 08/11/2014   CREATININE 0.80 01/09/2015 1525   CALCIUM 8.8 07/29/2014 0612   PROT 7.5 12/26/2013 2052   ALBUMIN 3.8 12/26/2013 2052   AST 31 08/11/2014   ALT 12 08/11/2014   ALKPHOS 83 12/26/2013 2052   BILITOT 0.4 12/26/2013 2052   GFRNONAA 64* 07/29/2014 0612   GFRAA 74* 07/29/2014 0612    Assessment and Plan  Hypertensive heart disease with CHF Chronic, stable controlled ; plan - cont norvasc 10 mg, atenolol 25 mg daily and imdur 30 mg daily  Diastolic CHF, chronic No exacerbations, stable;plan - cont atenolol 25 mg daily and lisinopril 5 mg daily; pt has not needed to be on lasix  Coronary atherosclerosis S/p stent, continues without problems; plan - cont Imdur 30 mg daily, atenolol 25 mg daily, and ASA    Margit HanksALEXANDER, Tyrion Glaude D, MD

## 2015-04-07 NOTE — Assessment & Plan Note (Signed)
No exacerbations, stable;plan - cont atenolol 25 mg daily and lisinopril 5 mg daily; pt has not needed to be on lasix

## 2015-04-07 NOTE — Assessment & Plan Note (Signed)
S/p stent, continues without problems; plan - cont Imdur 30 mg daily, atenolol 25 mg daily, and ASA

## 2015-04-13 ENCOUNTER — Encounter (HOSPITAL_COMMUNITY): Payer: Self-pay

## 2015-04-13 ENCOUNTER — Emergency Department (HOSPITAL_COMMUNITY): Payer: Medicare Other

## 2015-04-13 ENCOUNTER — Emergency Department (HOSPITAL_COMMUNITY)
Admission: EM | Admit: 2015-04-13 | Discharge: 2015-04-13 | Disposition: A | Discharge status: Home / Self Care | Payer: Medicare Other | Attending: Emergency Medicine | Admitting: Emergency Medicine

## 2015-04-13 DIAGNOSIS — E1149 Type 2 diabetes mellitus with other diabetic neurological complication: Secondary | ICD-10-CM | POA: Diagnosis not present

## 2015-04-13 DIAGNOSIS — I2510 Atherosclerotic heart disease of native coronary artery without angina pectoris: Secondary | ICD-10-CM | POA: Diagnosis not present

## 2015-04-13 DIAGNOSIS — Y92129 Unspecified place in nursing home as the place of occurrence of the external cause: Secondary | ICD-10-CM | POA: Diagnosis not present

## 2015-04-13 DIAGNOSIS — S0181XA Laceration without foreign body of other part of head, initial encounter: Secondary | ICD-10-CM | POA: Insufficient documentation

## 2015-04-13 DIAGNOSIS — E669 Obesity, unspecified: Secondary | ICD-10-CM | POA: Insufficient documentation

## 2015-04-13 DIAGNOSIS — Y998 Other external cause status: Secondary | ICD-10-CM | POA: Insufficient documentation

## 2015-04-13 DIAGNOSIS — W1839XA Other fall on same level, initial encounter: Secondary | ICD-10-CM | POA: Diagnosis not present

## 2015-04-13 DIAGNOSIS — S199XXA Unspecified injury of neck, initial encounter: Secondary | ICD-10-CM | POA: Diagnosis not present

## 2015-04-13 DIAGNOSIS — S0191XA Laceration without foreign body of unspecified part of head, initial encounter: Secondary | ICD-10-CM

## 2015-04-13 DIAGNOSIS — Z79899 Other long term (current) drug therapy: Secondary | ICD-10-CM | POA: Diagnosis not present

## 2015-04-13 DIAGNOSIS — Z880 Allergy status to penicillin: Secondary | ICD-10-CM | POA: Diagnosis not present

## 2015-04-13 DIAGNOSIS — F329 Major depressive disorder, single episode, unspecified: Secondary | ICD-10-CM | POA: Diagnosis not present

## 2015-04-13 DIAGNOSIS — S0990XA Unspecified injury of head, initial encounter: Secondary | ICD-10-CM | POA: Diagnosis present

## 2015-04-13 DIAGNOSIS — W19XXXA Unspecified fall, initial encounter: Secondary | ICD-10-CM

## 2015-04-13 DIAGNOSIS — Y9389 Activity, other specified: Secondary | ICD-10-CM | POA: Diagnosis not present

## 2015-04-13 DIAGNOSIS — Z8739 Personal history of other diseases of the musculoskeletal system and connective tissue: Secondary | ICD-10-CM | POA: Insufficient documentation

## 2015-04-13 DIAGNOSIS — I10 Essential (primary) hypertension: Secondary | ICD-10-CM | POA: Insufficient documentation

## 2015-04-13 DIAGNOSIS — Z7982 Long term (current) use of aspirin: Secondary | ICD-10-CM | POA: Insufficient documentation

## 2015-04-13 DIAGNOSIS — Z862 Personal history of diseases of the blood and blood-forming organs and certain disorders involving the immune mechanism: Secondary | ICD-10-CM | POA: Diagnosis not present

## 2015-04-13 DIAGNOSIS — F0390 Unspecified dementia without behavioral disturbance: Secondary | ICD-10-CM | POA: Insufficient documentation

## 2015-04-13 DIAGNOSIS — Z794 Long term (current) use of insulin: Secondary | ICD-10-CM | POA: Diagnosis not present

## 2015-04-13 MED ORDER — LIDOCAINE-EPINEPHRINE (PF) 2 %-1:200000 IJ SOLN
20.0000 mL | Freq: Once | INTRAMUSCULAR | Status: AC
  Administered 2015-04-13: 20 mL
  Filled 2015-04-13: qty 20

## 2015-04-13 NOTE — Progress Notes (Signed)
CSW attempted to speak with patient at bedside. However, she was asleep.  Per note[facility] witnessed fall out of wheelchair this morning. Also, the patient is from [facility].  Trish MageBrittney Marshe Shrestha, LCSWA [phone] ED CSW 04/13/2015 1:04 PM

## 2015-04-13 NOTE — ED Notes (Signed)
Bed: [id number] Expected date:  Expected time:  Means of arrival:  Comments: EMS 81 yo fall/laceration

## 2015-04-13 NOTE — ED Notes (Signed)
Per EMS, pr from [facility].  Pt has hx of falls.  Per staff at facility, pt had witnessed fall out of wheelchair this am. Placed back into bed and EMS called. Pt has hx of dementia and is oriented to baseline only. Knows name.  Pt with jagged laceration to upper rt frontal lobe above eye.  Bleeding controlled.  No LOC.  No blood thinners.  Pt is DNR.  C-collar in place for safety.  Pt seen by PA at facility.  Pt is lethargic on arrival.  Pupils equal and reactive to light. Vitals 140/60, hr 50, 92% ra, resp 16

## 2015-04-13 NOTE — ED Provider Notes (Signed)
CSN: [id number]     Arrival date & time 04/13/15  9562 History   First MD Initiated Contact with Patient 04/13/15 1000     No chief complaint on file.   Level V caveat: Dementia  HPI Patient was at her nursing facility today when she fell.  Patient has a history of dementia.  She is brought to the emergency department in a c-collar by EMS.  It sounds that this was an unwitnessed fall.  She presents with a large laceration of her forehead without active bleeding.  She is a DO NOT RESUSCITATE.  No family available at time of my initial history.    Past Medical History  Diagnosis Date  . Coronary atherosclerosis of unspecified type of vessel, native or graft   . Depressive disorder, not elsewhere classified   . Type II or unspecified type diabetes mellitus with neurological manifestations, not stated as uncontrolled   . Other and unspecified hyperlipidemia   . Unspecified essential hypertension   . Occlusion and stenosis of carotid artery without mention of cerebral infarction   . Personal history of other diseases of circulatory system   . Unspecified cerebral artery occlusion with cerebral infarction   . Polymyalgia rheumatica (HCC)   . Obesity, unspecified   . Leukocytosis, unspecified    Past Surgical History  Procedure Laterality Date  . Total abdominal hysterectomy    . Appendectomy    . Cataract surgery      bilateral w intraocular placement  . Refractive surgery      both eyes x6  . Posterior laminectomy / decompression cervical spine      w diskectomy and fusion of c5-c7 and anterior cervical plate  . Surgery of kidney stones      a few years ago   Family History  Problem Relation Age of Onset  . Coronary artery disease Other    Social History  Substance Use Topics  . Smoking status: Never Smoker   . Smokeless tobacco: Never Used  . Alcohol Use: No   OB History    No data available     Review of Systems  Unable to perform ROS: Dementia      Allergies   Benadryl; Codeine; Hydrocodone; Ibuprofen; Penicillins; and Sulfonamide derivatives  Home Medications   Prior to Admission medications   Medication Sig Start Date End Date Taking? Authorizing Provider  amLODipine (NORVASC) 10 MG tablet Take 10 mg by mouth every morning.     Historical Provider, MD  aspirin 81 MG chewable tablet Chew 81 mg by mouth every morning.    Historical Provider, MD  atenolol (TENORMIN) 25 MG tablet Take 25 mg by mouth every morning.     Historical Provider, MD  bisacodyl (DULCOLAX) 10 MG suppository Place 10 mg rectally as needed for moderate constipation.    Historical Provider, MD  clonazePAM Scarlette Calico) 0.5 MG tablet Take one tablet by mouth twice daily 05/18/14   Sharon Seller, NP  Cranberry-Vitamin C-Inulin (UTI-STAT PO) Take 30 mLs by mouth daily.    Historical Provider, MD  divalproex (DEPAKOTE ER) 250 MG 24 hr tablet Take 500 mg by mouth at bedtime.    Historical Provider, MD  divalproex (DEPAKOTE SPRINKLE) 125 MG capsule Take 375-500 mg by mouth 2 (two) times daily.  at 9am and at 12noon    Historical Provider, MD  Emollient (GOLD BOND ULTIMATE) LOTN Apply 1 application topically 2 (two) times daily.    Historical Provider, MD  ferrous sulfate 325 (65 FE)  MG tablet Take 325 mg by mouth daily with breakfast.      Historical Provider, MD  gabapentin (NEURONTIN) 100 MG capsule Take 100 mg by mouth 2 (two) times daily.     Historical Provider, MD  GLUCERNA (GLUCERNA) LIQD Take 237 mLs by mouth 3 (three) times daily.    Historical Provider, MD  Insulin Glargine (LANTUS SOLOSTAR) 100 UNIT/ML Solostar Pen Inject 25 Units into the skin daily.    Historical Provider, MD  insulin lispro (HUMALOG KWIKPEN) 100 UNIT/ML KiwkPen Inject 0-10 Units into the skin 3 (three) times daily after meals. 5 units after meals if CBG is over 250 and 10 units after meals if CBG is over 400    Historical Provider, MD  isosorbide mononitrate (IMDUR) 30 MG 24 hr tablet Take 30 mg by  mouth daily.    Historical Provider, MD  linagliptin (TRADJENTA) 5 MG TABS tablet Take 5 mg by mouth every morning. For diabetes    Historical Provider, MD  lisinopril (PRINIVIL,ZESTRIL) 5 MG tablet Take 5 mg by mouth daily.    Historical Provider, MD  LORazepam (ATIVAN) 2 MG/ML injection Give 0.33ml intramuscular every 6 hours as needed for agitation/aggressive behavior Patient not taking: Reported on 01/09/2015 03/03/14   Kimber Relic, MD  magnesium hydroxide (MILK OF MAGNESIA) 400 MG/5ML suspension Take 30 mLs by mouth daily as needed for mild constipation.    Historical Provider, MD  nitroGLYCERIN (NITROSTAT) 0.4 MG SL tablet Place 0.4 mg under the tongue every 5 (five) minutes as needed for chest pain.    Historical Provider, MD  OLANZapine (ZYPREXA) 5 MG tablet Take 5 mg by mouth at bedtime.    Historical Provider, MD  PRESCRIPTION MEDICATION Take 120 mLs by mouth 3 (three) times daily. Medpass    Historical Provider, MD  QUEtiapine (SEROQUEL) 50 MG tablet Take 50 mg by mouth 2 (two) times daily. For psychosis    Historical Provider, MD  senna (SENOKOT) 8.6 MG tablet Take 2 tablets by mouth daily.    Historical Provider, MD  sucralfate (CARAFATE) 1 G tablet Take 1 g by mouth 2 (two) times daily.     Historical Provider, MD  Vitamin D, Ergocalciferol, (DRISDOL) 50000 UNITS CAPS capsule Take 50,000 Units by mouth every 30 (thirty) days.    Historical Provider, MD   There were no vitals taken for this visit. Physical Exam  Constitutional: She appears well-developed and well-nourished. No distress.  HENT:  Head: Normocephalic and atraumatic.  Eyes: EOM are normal.  Neck: Neck supple.  Mild cervical paracervical tenderness without cervical step-offs.  C-spine immobilized in cervical collar.  Cardiovascular: Normal rate, regular rhythm and normal heart sounds.   Pulmonary/Chest: Effort normal and breath sounds normal.  Abdominal: Soft. She exhibits no distension. There is no tenderness.   Musculoskeletal: Normal range of motion.  Full range of motion bilateral hips, knees, ankles.  Full range of motion bilateral wrists, elbows, shoulders.  Neurological: She is alert.  Follows very simple commands  Skin: Skin is warm and dry.  Psychiatric: She has a normal mood and affect. Judgment normal.  Nursing note and vitals reviewed.   ED Course  Procedures (including critical care time)  LACERATION REPAIR Performed by: Lyanne Co Consent: Verbal consent obtained. Risks and benefits: risks, benefits and alternatives were discussed Patient identity confirmed: provided demographic data Time out performed prior to procedure Prepped and Draped in normal sterile fashion Wound explored Laceration Location: forehead Laceration Length: 7cm No Foreign Bodies seen or palpated  Anesthesia: local infiltration Local anesthetic: lidocaine 2% with epinephrine Anesthetic total: 8 ml Irrigation method: syringe Amount of cleaning: standard Skin closure: 4-0 prolene Number of sutures or staples: 12 Technique: running interlocked  Patient tolerance: Patient tolerated the procedure well with no immediate complications.   Labs Review Labs Reviewed - No data to display  Imaging Review No results found. I have personally reviewed and evaluated these images and lab results as part of my medical decision-making.   EKG Interpretation None      MDM   Final diagnoses:  None    Laceration repaired.  CT head and C-spine negative.  Discharge home in good condition.  Full range of motion of major joints.    Azalia BilisKevin Diana Davenport, MD 04/14/15 [phone]

## 2015-06-09 ENCOUNTER — Encounter: Payer: Self-pay | Admitting: Internal Medicine

## 2015-06-09 ENCOUNTER — Non-Acute Institutional Stay (SKILLED_NURSING_FACILITY): Payer: Medicare Other | Admitting: Internal Medicine

## 2015-06-09 DIAGNOSIS — K219 Gastro-esophageal reflux disease without esophagitis: Secondary | ICD-10-CM | POA: Diagnosis not present

## 2015-06-09 DIAGNOSIS — E1143 Type 2 diabetes mellitus with diabetic autonomic (poly)neuropathy: Secondary | ICD-10-CM

## 2015-06-09 DIAGNOSIS — F6089 Other specific personality disorders: Secondary | ICD-10-CM

## 2015-06-09 DIAGNOSIS — R4689 Other symptoms and signs involving appearance and behavior: Secondary | ICD-10-CM

## 2015-06-09 NOTE — Progress Notes (Signed)
MRN: [id number] Name: [name] [name]  Sex: female Age: 82 y.o. DOB: 1926-06-13  [facility]: [name] Facility/Room: Level Of Care: SNF Provider: Merrilee Seashore D Emergency Contacts: Extended Emergency Contact Information Primary Emergency Contact: [name],[name] Address: [address]          [place], [place] [address] [name] of [place] Home Phone: [phone] Mobile Phone: [phone] Relation: Daughter Secondary Emergency Contact: [name] Address: [address] [place] [address] [name] of [place] Home Phone: [phone] Mobile Phone: [phone] Relation: Son  Code Status:   Allergies: Benadryl; Codeine; Hydrocodone; Ibuprofen; Penicillins; and Sulfonamide derivatives  Chief Complaint  Patient presents with  . Medical Management of Chronic Issues    HPI: Patient is 82 y.o. female who is bing seen for routine issues of peripheral neuropathy and GERD. Today she is also reported as being combative which she is not usually and pt is being seen for that as well.  Past Medical History  Diagnosis Date  . Coronary atherosclerosis of unspecified type of vessel, native or graft   . Depressive disorder, not elsewhere classified   . Type II or unspecified type diabetes mellitus with neurological manifestations, not stated as uncontrolled   . Other and unspecified hyperlipidemia   . Unspecified essential hypertension   . Occlusion and stenosis of carotid artery without mention of cerebral infarction   . Personal history of other diseases of circulatory system   . Unspecified cerebral artery occlusion with cerebral infarction   . Polymyalgia rheumatica (HCC)   . Obesity, unspecified   . Leukocytosis, unspecified     Past Surgical History  Procedure Laterality Date  . Total abdominal hysterectomy    . Appendectomy    . Cataract surgery      bilateral w intraocular placement  . Refractive surgery      both eyes x6  . Posterior  laminectomy / decompression cervical spine      w diskectomy and fusion of c5-c7 and anterior cervical plate  . Surgery of kidney stones      a few years ago      Medication List       This list is accurate as of: 06/09/15 11:59 PM.  Always use your most recent med list.               amLODipine 10 MG tablet  Commonly known as:  NORVASC  Take 10 mg by mouth every morning.     aspirin 81 MG chewable tablet  Chew 81 mg by mouth every morning.     atenolol 25 MG tablet  Commonly known as:  TENORMIN  Take 25 mg by mouth every morning.     bisacodyl 10 MG suppository  Commonly known as:  DULCOLAX  Place 10 mg rectally as needed for moderate constipation.     clonazePAM 0.5 MG tablet  Commonly known as:  KLONOPIN  Take one tablet by mouth twice daily     divalproex 125 MG capsule  Commonly known as:  DEPAKOTE SPRINKLE  Take 375 mg by mouth 3 (three) times daily. 375mg  at 9am and at 12noon     ferrous sulfate 325 (65 FE) MG tablet  Take 325 mg by mouth daily with breakfast.     gabapentin 100 MG capsule  Commonly known as:  NEURONTIN  Take 100 mg by mouth 2 (two) times daily.     GOLD BOND ULTIMATE Lotn  Apply 1 application topically  2 (two) times daily.     isosorbide mononitrate 30 MG 24 hr tablet  Commonly known as:  IMDUR  Take 30 mg by mouth every evening.     lactulose 10 GM/15ML solution  Commonly known as:  CHRONULAC  Take 30 g by mouth 3 (three) times daily.     LANTUS SOLOSTAR 100 UNIT/ML Solostar Pen  Generic drug:  Insulin Glargine  Inject 25 Units into the skin daily.     linagliptin 5 MG Tabs tablet  Commonly known as:  TRADJENTA  Take 5 mg by mouth every morning. For diabetes     lisinopril 5 MG tablet  Commonly known as:  PRINIVIL,ZESTRIL  Take 5 mg by mouth daily.     nitroGLYCERIN 0.4 MG SL tablet  Commonly known as:  NITROSTAT  Place 0.4 mg under the tongue every 5 (five) minutes as needed for chest pain.     OLANZapine 5 MG tablet   Commonly known as:  ZYPREXA  Take 5 mg by mouth at bedtime.     QUEtiapine 50 MG tablet  Commonly known as:  SEROQUEL  Take 50 mg by mouth 2 (two) times daily. For psychosis     senna 8.6 MG tablet  Commonly known as:  SENOKOT  Take 2 tablets by mouth daily.     sucralfate 1 g tablet  Commonly known as:  CARAFATE  Take 1 g by mouth 2 (two) times daily.     vitamin C 500 MG tablet  Commonly known as:  ASCORBIC ACID  Take 500 mg by mouth daily.     Vitamin D (Ergocalciferol) 50000 units Caps capsule  Commonly known as:  DRISDOL  Take 50,000 Units by mouth every 30 (thirty) days.        No orders of the defined types were placed in this encounter.    Immunization History  Administered Date(s) Administered  . Influenza Whole 01/30/2013  . Influenza-Unspecified 01/30/2013  . PPD Test 07/27/2008  . Tdap 01/09/2015    Social History  Substance Use Topics  . Smoking status: Never Smoker   . Smokeless tobacco: Never Used  . Alcohol Use: No    Review of Systems UTO 2/2 pt dementia and MS today ;per nursing pt is being cmbative today and she has nothing for that    Filed Vitals:   06/09/15 1441  BP: 130/73  Pulse: 63  Temp: 96.9 F (36.1 C)  Resp: 16    Physical Exam  GENERAL APPEARANCE: Alert, minconversant, No acute distress  SKIN: No diaphoresis rash, or wounds HEENT: Unremarkable RESPIRATORY: Breathing is even, unlabored. Lung sounds are clear   CARDIOVASCULAR: Heart RRR no murmurs, rubs or gallops. No peripheral edema  GASTROINTESTINAL: Abdomen is soft, non-tender, not distended w/ normal bowel sounds.  GENITOURINARY: Bladder non tender, not distended  MUSCULOSKELETAL: No abnormal joints or musculature NEUROLOGIC: Cranial nerves 2-12 grossly intact. Moves all extremities PSYCHIATRIC: pt is being difficult today with exam, this is not her usual  Patient Active Problem List   Diagnosis Date Noted  . E. coli UTI 06/16/2015  . Nonthrombocytopenic  purpura (HCC) 03/03/2015  . Anemia, chronic disease 12/30/2014  . Thrombocytopenia (HCC) 12/30/2014  . Allergic rhinitis 11/20/2014  . Psychosis in elderly with behavioral disturbance 09/13/2014  . Falls frequently 09/13/2014  . Diastolic CHF, chronic (HCC) 06/05/2014  . DM (diabetes mellitus), type 2 with complications (HCC) 06/05/2014  . GERD (gastroesophageal reflux disease) 06/05/2014  . Bipolar disorder (HCC) 05/05/2014  . CKD (chronic  kidney disease) stage 3, GFR 30-59 ml/min 02/13/2014  . Aggression 12/27/2013  . Vascular dementia with behavioral disturbance 12/27/2013  . Dyspnea 11/17/2010  . Peripheral autonomic neuropathy due to DM (HCC) 07/31/2008  . Hyperlipidemia LDL goal <70 07/31/2008  . OBESITY 07/31/2008  . LEUKOCYTOSIS 07/31/2008  . DEPRESSION 07/31/2008  . Hypertensive heart disease with CHF (HCC) 07/31/2008  . Coronary atherosclerosis 07/31/2008  . CAROTID ARTERY STENOSIS 07/31/2008  . CVA 07/31/2008  . POLYMYALGIA RHEUMATICA 07/31/2008  . DIABETES MELLITUS, TYPE II, HX OF 07/31/2008  . ANEURYSM, HX OF 07/31/2008    CBC    Component Value Date/Time   WBC 12.0* 01/09/2015 1516   RBC 4.19 01/09/2015 1516   HGB 13.6 01/09/2015 1525   HCT 40.0 01/09/2015 1525   PLT 131* 01/09/2015 1516   MCV 92.1 01/09/2015 1516   LYMPHSABS 4.0 01/09/2015 1516   MONOABS 1.1* 01/09/2015 1516   EOSABS 0.1 01/09/2015 1516   BASOSABS 0.0 01/09/2015 1516    CMP     Component Value Date/Time   NA 141 01/09/2015 1525   NA 148* 08/11/2014   K 4.5 01/09/2015 1525   CL 101 01/09/2015 1525   CO2 30 07/29/2014 0612   GLUCOSE 149* 01/09/2015 1525   BUN 25* 01/09/2015 1525   BUN 25* 08/11/2014   CREATININE 0.80 01/09/2015 1525   CALCIUM 8.8 07/29/2014 0612   PROT 7.5 12/26/2013 2052   ALBUMIN 3.8 12/26/2013 2052   AST 31 08/11/2014   ALT 12 08/11/2014   ALKPHOS 83 12/26/2013 2052   BILITOT 0.4 12/26/2013 2052   GFRNONAA 64* 07/29/2014 0612   GFRAA 74* 07/29/2014 0612     Assessment and Plan  Peripheral autonomic neuropathy due to DM Chronic and stable-cont neurontin 100 mg BID  GERD (gastroesophageal reflux disease) Pt has had no incidence of reflux or aspiration of carafate BID;plan - Cont carafate BID  Aggression Today pt has been combative, which is not her usual. Could be progression of dementia and have orderd prn Ativan; i have also ordered a U/A    Margit Hanks, MD

## 2015-06-11 ENCOUNTER — Encounter: Payer: Self-pay | Admitting: Internal Medicine

## 2015-06-11 ENCOUNTER — Non-Acute Institutional Stay (SKILLED_NURSING_FACILITY): Payer: Medicare Other | Admitting: Internal Medicine

## 2015-06-11 DIAGNOSIS — F0391 Unspecified dementia with behavioral disturbance: Secondary | ICD-10-CM

## 2015-06-11 DIAGNOSIS — F01518 Vascular dementia, unspecified severity, with other behavioral disturbance: Secondary | ICD-10-CM

## 2015-06-11 DIAGNOSIS — N390 Urinary tract infection, site not specified: Secondary | ICD-10-CM

## 2015-06-11 DIAGNOSIS — B9620 Unspecified Escherichia coli [E. coli] as the cause of diseases classified elsewhere: Secondary | ICD-10-CM

## 2015-06-11 DIAGNOSIS — F03918 Unspecified dementia, unspecified severity, with other behavioral disturbance: Secondary | ICD-10-CM

## 2015-06-11 DIAGNOSIS — F0151 Vascular dementia with behavioral disturbance: Secondary | ICD-10-CM

## 2015-06-11 NOTE — Progress Notes (Addendum)
MRN: [id number] Name: [name] [name]  Sex: female Age: 82 y.o. DOB: Jan 24, 1927  [facility]: [name] Facility/Room: Level Of Care: SNF Provider: Merrilee Seashore D Emergency Contacts: Extended Emergency Contact Information Primary Emergency Contact: [name],[name] Address: [address]          [place], [place] [address] [name] of [place] Home Phone: [phone] Mobile Phone: [phone] Relation: Daughter Secondary Emergency Contact: [name] Address: [address], [place] [address] [name] of [place] Home Phone: [phone] Mobile Phone: [phone] Relation: Son  Code Status:   Allergies: Benadryl; Codeine; Hydrocodone; Ibuprofen; Penicillins; and Sulfonamide derivatives  Chief Complaint  Patient presents with  . Acute Visit    HPI: Patient is 82 y.o. female who is being seen for follow up on mental status change of several days ago. Pt had a U/A done at that time and the MIC has returned today.  Past Medical History  Diagnosis Date  . Coronary atherosclerosis of unspecified type of vessel, native or graft   . Depressive disorder, not elsewhere classified   . Type II or unspecified type diabetes mellitus with neurological manifestations, not stated as uncontrolled   . Other and unspecified hyperlipidemia   . Unspecified essential hypertension   . Occlusion and stenosis of carotid artery without mention of cerebral infarction   . Personal history of other diseases of circulatory system   . Unspecified cerebral artery occlusion with cerebral infarction   . Polymyalgia rheumatica (HCC)   . Obesity, unspecified   . Leukocytosis, unspecified     Past Surgical History  Procedure Laterality Date  . Total abdominal hysterectomy    . Appendectomy    . Cataract surgery      bilateral w intraocular placement  . Refractive surgery      both eyes x6  . Posterior laminectomy / decompression cervical spine      w diskectomy and fusion  of c5-c7 and anterior cervical plate  . Surgery of kidney stones      a few years ago      Medication List       This list is accurate as of: 06/11/15 11:59 PM.  Always use your most recent med list.               amLODipine 10 MG tablet  Commonly known as:  NORVASC  Take 10 mg by mouth every morning.     aspirin 81 MG chewable tablet  Chew 81 mg by mouth every morning.     atenolol 25 MG tablet  Commonly known as:  TENORMIN  Take 25 mg by mouth every morning.     bisacodyl 10 MG suppository  Commonly known as:  DULCOLAX  Place 10 mg rectally as needed for moderate constipation.     clonazePAM 0.5 MG tablet  Commonly known as:  KLONOPIN  Take one tablet by mouth twice daily     divalproex 125 MG capsule  Commonly known as:  DEPAKOTE SPRINKLE  Take 375 mg by mouth 3 (three) times daily.  at 9am and at 12noon     ferrous sulfate 325 (65 FE) MG tablet  Take 325 mg by mouth daily with breakfast.     gabapentin 100 MG capsule  Commonly known as:  NEURONTIN  Take 100 mg by mouth 2 (two) times daily.     GOLD BOND ULTIMATE Lotn  Apply 1 application topically 2 (two) times daily.  isosorbide mononitrate 30 MG 24 hr tablet  Commonly known as:  IMDUR  Take 30 mg by mouth every evening.     lactulose 10 GM/15ML solution  Commonly known as:  CHRONULAC  Take 30 g by mouth 3 (three) times daily.     LANTUS SOLOSTAR 100 UNIT/ML Solostar Pen  Generic drug:  Insulin Glargine  Inject 25 Units into the skin daily.     linagliptin 5 MG Tabs tablet  Commonly known as:  TRADJENTA  Take 5 mg by mouth every morning. For diabetes     lisinopril 5 MG tablet  Commonly known as:  PRINIVIL,ZESTRIL  Take 5 mg by mouth daily.     nitroGLYCERIN 0.4 MG SL tablet  Commonly known as:  NITROSTAT  Place 0.4 mg under the tongue every 5 (five) minutes as needed for chest pain.     OLANZapine 5 MG tablet  Commonly known as:  ZYPREXA  Take 5 mg by mouth at bedtime.      QUEtiapine 50 MG tablet  Commonly known as:  SEROQUEL  Take 50 mg by mouth 2 (two) times daily. For psychosis     senna 8.6 MG tablet  Commonly known as:  SENOKOT  Take 2 tablets by mouth daily.     sucralfate 1 g tablet  Commonly known as:  CARAFATE  Take 1 g by mouth 2 (two) times daily.     vitamin C 500 MG tablet  Commonly known as:  ASCORBIC ACID  Take 500 mg by mouth daily.     Vitamin D (Ergocalciferol) 50000 units Caps capsule  Commonly known as:  DRISDOL  Take 50,000 Units by mouth every 30 (thirty) days.        No orders of the defined types were placed in this encounter.    Immunization History  Administered Date(s) Administered  . Influenza Whole 01/30/2013  . Influenza-Unspecified 01/30/2013  . PPD Test 07/27/2008  . Tdap 01/09/2015    Social History  Substance Use Topics  . Smoking status: Never Smoker   . Smokeless tobacco: Never Used  . Alcohol Use: No    Review of Systems  DATA OBTAINED: from patient, nurse, medical record, family member GENERAL:  no fevers, fatigue, appetite changes SKIN: No itching, rash HEENT: No complaint RESPIRATORY: No cough, wheezing, SOB CARDIAC: No chest pain, palpitations, lower extremity edema  GI: No abdominal pain, No N/V/D or constipation, No heartburn or reflux  GU: No dysuria, frequency or urgency, or incontinence  MUSCULOSKELETAL: No unrelieved bone/joint pain NEUROLOGIC: No headache, dizziness  PSYCHIATRIC: No overt anxiety or sadness  Filed Vitals:   06/11/15 1517  BP: 130/73  Pulse: 63  Temp: 96.9 F (36.1 C)  Resp: 16    Physical Exam  GENERAL APPEARANCE: Alert, conversant, No acute distress  SKIN: No diaphoresis rash, or wounds HEENT: Unremarkable RESPIRATORY: Breathing is even, unlabored. Lung sounds are clear   CARDIOVASCULAR: Heart RRR no murmurs, rubs or gallops. No peripheral edema  GASTROINTESTINAL: Abdomen is soft, non-tender, not distended w/ normal bowel sounds.  GENITOURINARY:  Bladder non tender, not distended  MUSCULOSKELETAL: No abnormal joints or musculature NEUROLOGIC: Cranial nerves 2-12 grossly intact. Moves all extremities PSYCHIATRIC: Mood and affect appropriate to situation, no behavioral issues  Patient Active Problem List   Diagnosis Date Noted  . E. coli UTI 06/16/2015  . Nonthrombocytopenic purpura (HCC) 03/03/2015  . Anemia, chronic disease 12/30/2014  . Thrombocytopenia (HCC) 12/30/2014  . Allergic rhinitis 11/20/2014  . Psychosis in elderly with  behavioral disturbance 09/13/2014  . Falls frequently 09/13/2014  . Diastolic CHF, chronic (HCC) 06/05/2014  . DM (diabetes mellitus), type 2 with complications (HCC) 06/05/2014  . GERD (gastroesophageal reflux disease) 06/05/2014  . Bipolar disorder (HCC) 05/05/2014  . CKD (chronic kidney disease) stage 3, GFR 30-59 ml/min 02/13/2014  . Aggression 12/27/2013  . Vascular dementia with behavioral disturbance 12/27/2013  . Dyspnea 11/17/2010  . Peripheral autonomic neuropathy due to DM (HCC) 07/31/2008  . Hyperlipidemia LDL goal <70 07/31/2008  . OBESITY 07/31/2008  . LEUKOCYTOSIS 07/31/2008  . DEPRESSION 07/31/2008  . Hypertensive heart disease with CHF (HCC) 07/31/2008  . Coronary atherosclerosis 07/31/2008  . CAROTID ARTERY STENOSIS 07/31/2008  . CVA 07/31/2008  . POLYMYALGIA RHEUMATICA 07/31/2008  . DIABETES MELLITUS, TYPE II, HX OF 07/31/2008  . ANEURYSM, HX OF 07/31/2008    CBC    Component Value Date/Time   WBC 12.0* 01/09/2015 1516   RBC 4.19 01/09/2015 1516   HGB 13.6 01/09/2015 1525   HCT 40.0 01/09/2015 1525   PLT 131* 01/09/2015 1516   MCV 92.1 01/09/2015 1516   LYMPHSABS 4.0 01/09/2015 1516   MONOABS 1.1* 01/09/2015 1516   EOSABS 0.1 01/09/2015 1516   BASOSABS 0.0 01/09/2015 1516    CMP     Component Value Date/Time   NA 141 01/09/2015 1525   NA 148* 08/11/2014   K 4.5 01/09/2015 1525   CL 101 01/09/2015 1525   CO2 30 07/29/2014 0612   GLUCOSE 149* 01/09/2015  1525   BUN 25* 01/09/2015 1525   BUN 25* 08/11/2014   CREATININE 0.80 01/09/2015 1525   CALCIUM 8.8 07/29/2014 0612   PROT 7.5 12/26/2013 2052   ALBUMIN 3.8 12/26/2013 2052   AST 31 08/11/2014   ALT 12 08/11/2014   ALKPHOS 83 12/26/2013 2052   BILITOT 0.4 12/26/2013 2052   GFRNONAA 64* 07/29/2014 0612   GFRAA 74* 07/29/2014 0612    Assessment and Plan  E. coli UTI Pt's MIC returned with E Coli, pan sensitive but pt is allergic to PCN ans Sulfa drugs. Pt's CrCl calculated at 76 so will use the only po possibility on the list macrobid 100 mg BID for 7 days; will monitor for pt improvement.  Vascular dementia with behavioral disturbance Pt had had agitation 2 days ago and U/A was ordered;aggitation is ongoing but pt tx will start today.    Margit Hanks, MD

## 2015-06-16 ENCOUNTER — Encounter: Payer: Self-pay | Admitting: Internal Medicine

## 2015-06-16 DIAGNOSIS — N390 Urinary tract infection, site not specified: Principal | ICD-10-CM

## 2015-06-16 DIAGNOSIS — B9620 Unspecified Escherichia coli [E. coli] as the cause of diseases classified elsewhere: Secondary | ICD-10-CM | POA: Insufficient documentation

## 2015-06-16 NOTE — Assessment & Plan Note (Signed)
Pt had had agitation 2 days ago and U/A was ordered;aggitation is ongoing but pt tx will start today.

## 2015-06-16 NOTE — Assessment & Plan Note (Signed)
Pt's MIC returned with E Coli, pan sensitive but pt is allergic to PCN ans Sulfa drugs. Pt's CrCl calculated at 76 so will use the only po possibility on the list macrobid 100 mg BID for 7 days; will monitor for pt improvement.

## 2015-06-16 NOTE — Assessment & Plan Note (Signed)
Pt has had no incidence of reflux or aspiration of carafate BID;plan - Cont carafate BID

## 2015-06-16 NOTE — Assessment & Plan Note (Signed)
Chronic and stable-cont neurontin 100 mg BID

## 2015-06-16 NOTE — Assessment & Plan Note (Signed)
Today pt has been combative, which is not her usual. Could be progression of dementia and have orderd prn Ativan; i have also ordered a U/A

## 2015-06-30 DEATH — deceased

## 2016-03-29 IMAGING — CT CT CERVICAL SPINE W/O CM
2 of 5 series · 4 of 14 positions shown, 5 images · non-contrast
Comparison: CT head cervical 09/29/2013

CLINICAL DATA: Dementia.  Fall.  Patient is combative.

EXAM:
CT HEAD WITHOUT CONTRAST
CT CERVICAL SPINE WITHOUT CONTRAST
TECHNIQUE: Multidetector CT imaging of the head and cervical spine was
performed following the standard protocol without intravenous
contrast. Multiplanar CT image reconstructions of the cervical spine
were also generated.

[Series 6: c-spine st · axial · 0.26mm/px · z∈[-232,-174]mm · 2 of 89 slices shown]
[im 30/89  bone]
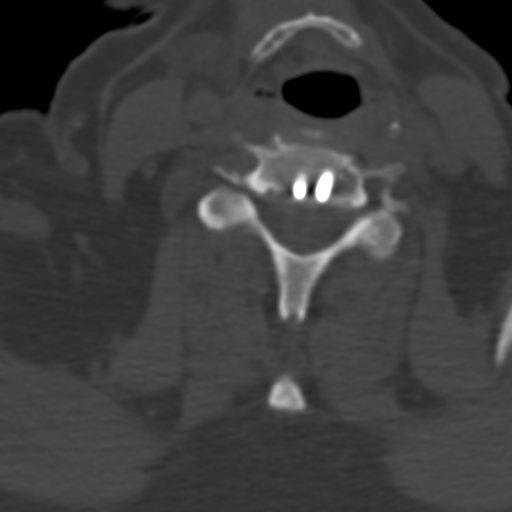
[im 59/89  bone]
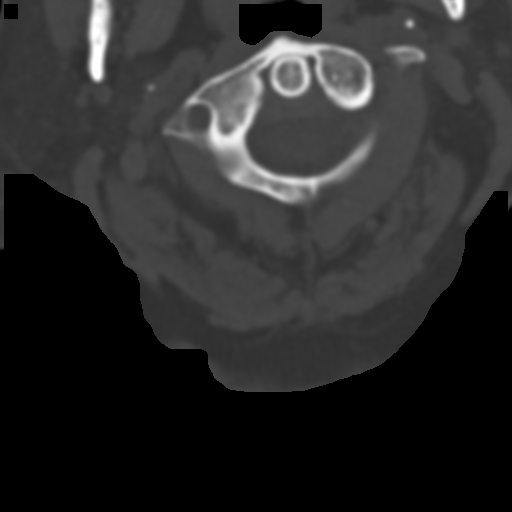

[Series 9: axial reformats · axial · 0.23mm/px · z∈[-254,-194]mm · 2 of 96 slices shown, 3 images]
[im 32/96  soft-tissue]
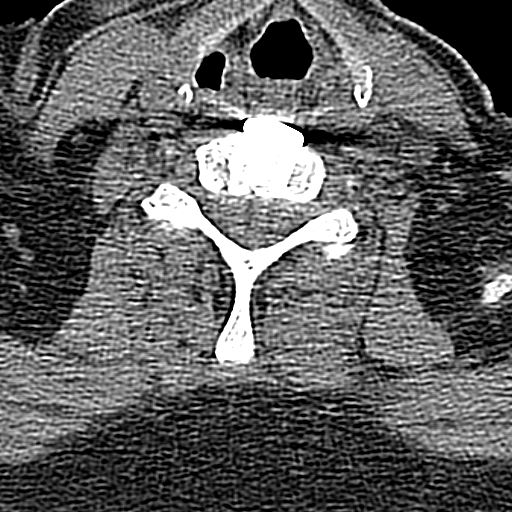
[im 32/96  bone]
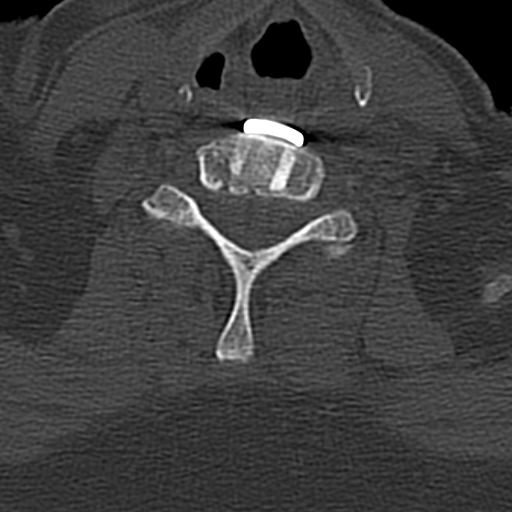
[im 64/96  bone]
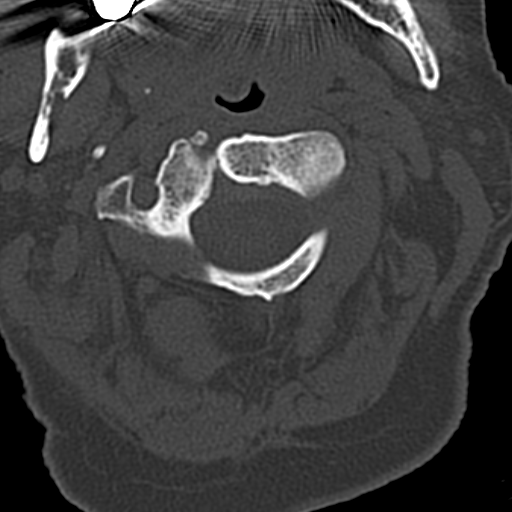

[4 of 14 positions shown; findings below may reference images not displayed]

FINDINGS: CT HEAD FINDINGS

Prominent cerebral atrophy bilaterally. Ventricular dilatation is
probably due to central atrophy. Low-attenuation changes in the deep
white matter consistent with small vessel ischemia. No mass effect
or midline shift. No abnormal extra-axial fluid collections.
Gray-white matter junctions are distinct. Basal cisterns are not
effaced. No evidence of acute intracranial hemorrhage. No depressed
skull fractures. Minimal mucosal thickening in the maxillary antra.
Vascular calcifications. Mastoid air cells are not opacified.

CT CERVICAL SPINE FINDINGS

Postoperative changes with anterior plate and screw fixation and
intervertebral fusion from C5-C7. Do segments appear intact.
Hardware appears well seated. Normal alignment of the cervical spine
and facet joints. C1-2 articulation appears intact. No vertebral
compression deformities. Degenerative changes at C3-4 and C4-5
levels. No prevertebral soft tissue swelling. No focal bone lesion
or bone destruction. Bone cortex and trabecular architecture appears
intact. Enlarged right thyroid gland likely due to goiter.
IMPRESSION: No acute intracranial abnormalities. Chronic atrophy and small
vessel ischemic changes.

Postoperative anterior fusion from C5-C7. Normal alignment of
cervical spine. No displaced fractures identified.
